# Patient Record
Sex: Male | Born: 1945 | Race: White | Hispanic: No | Marital: Married | Smoking: Never smoker
Health system: Southern US, Community
[De-identification: ages and names within clinical notes are randomized; demographics above are authoritative.]

## PROBLEM LIST (undated history)

## (undated) DIAGNOSIS — I340 Nonrheumatic mitral (valve) insufficiency: Secondary | ICD-10-CM

## (undated) DIAGNOSIS — M1990 Unspecified osteoarthritis, unspecified site: Secondary | ICD-10-CM

## (undated) DIAGNOSIS — I219 Acute myocardial infarction, unspecified: Secondary | ICD-10-CM

## (undated) DIAGNOSIS — I5022 Chronic systolic (congestive) heart failure: Secondary | ICD-10-CM

## (undated) DIAGNOSIS — I4891 Unspecified atrial fibrillation: Secondary | ICD-10-CM

## (undated) DIAGNOSIS — Z7901 Long term (current) use of anticoagulants: Secondary | ICD-10-CM

## (undated) DIAGNOSIS — I5042 Chronic combined systolic (congestive) and diastolic (congestive) heart failure: Secondary | ICD-10-CM

## (undated) DIAGNOSIS — I10 Essential (primary) hypertension: Principal | ICD-10-CM

## (undated) DIAGNOSIS — R55 Syncope and collapse: Secondary | ICD-10-CM

## (undated) DIAGNOSIS — I495 Sick sinus syndrome: Secondary | ICD-10-CM

## (undated) HISTORY — DX: Sick sinus syndrome: I49.5

## (undated) HISTORY — DX: Syncope and collapse: R55

## (undated) HISTORY — PX: US ECHOCARDIOGRAPHY: HXRAD669

## (undated) HISTORY — DX: Unspecified atrial fibrillation: I48.91

## (undated) HISTORY — DX: Long term (current) use of anticoagulants: Z79.01

## (undated) HISTORY — DX: Nonrheumatic mitral (valve) insufficiency: I34.0

## (undated) HISTORY — PX: CARDIAC CATHETERIZATION: SHX172

## (undated) HISTORY — DX: Essential (primary) hypertension: I10

## (undated) HISTORY — PX: OTHER SURGICAL HISTORY: SHX169

## (undated) HISTORY — PX: TONSILLECTOMY: SUR1361

## (undated) HISTORY — DX: Chronic systolic (congestive) heart failure: I50.22

---
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
## 1989-06-18 HISTORY — PX: INGUINAL HERNIA REPAIR: SUR1180

## 2003-09-24 ENCOUNTER — Ambulatory Visit (HOSPITAL_COMMUNITY): Admission: RE | Admit: 2003-09-24 | Discharge: 2003-09-24 | Payer: Self-pay | Admitting: Cardiology

## 2003-11-24 ENCOUNTER — Emergency Department (HOSPITAL_COMMUNITY): Admission: EM | Admit: 2003-11-24 | Discharge: 2003-11-25 | Payer: Self-pay | Admitting: Emergency Medicine

## 2004-10-04 ENCOUNTER — Ambulatory Visit: Payer: Self-pay | Admitting: Pulmonary Disease

## 2004-10-04 ENCOUNTER — Inpatient Hospital Stay (HOSPITAL_COMMUNITY): Admission: EM | Admit: 2004-10-04 | Discharge: 2004-10-06 | Payer: Self-pay | Admitting: Emergency Medicine

## 2004-10-05 ENCOUNTER — Encounter: Payer: Self-pay | Admitting: Cardiology

## 2004-10-30 ENCOUNTER — Ambulatory Visit: Payer: Self-pay | Admitting: Pulmonary Disease

## 2005-05-03 ENCOUNTER — Ambulatory Visit: Payer: Self-pay | Admitting: Infectious Diseases

## 2005-05-03 ENCOUNTER — Inpatient Hospital Stay (HOSPITAL_COMMUNITY): Admission: AD | Admit: 2005-05-03 | Discharge: 2005-05-10 | Payer: Self-pay | Admitting: Internal Medicine

## 2005-05-07 ENCOUNTER — Encounter (INDEPENDENT_AMBULATORY_CARE_PROVIDER_SITE_OTHER): Payer: Self-pay | Admitting: *Deleted

## 2006-09-30 IMAGING — CR DG LUMBAR SPINE COMPLETE 4+V
5 series · 5 of 5 positions shown · non-contrast
Comparison: Study 10/04/04.

CLINICAL DATA: Fever, gait instability.  
 V27M1-Q VIEW:

[t l-spine a.p.]
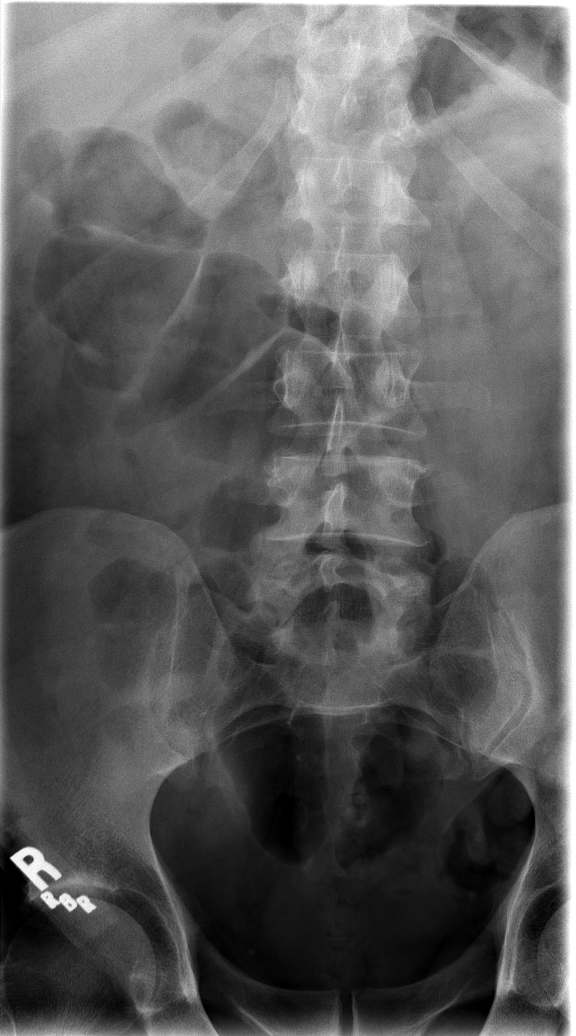

[t l-spine oblique exposure (1 of 2)]
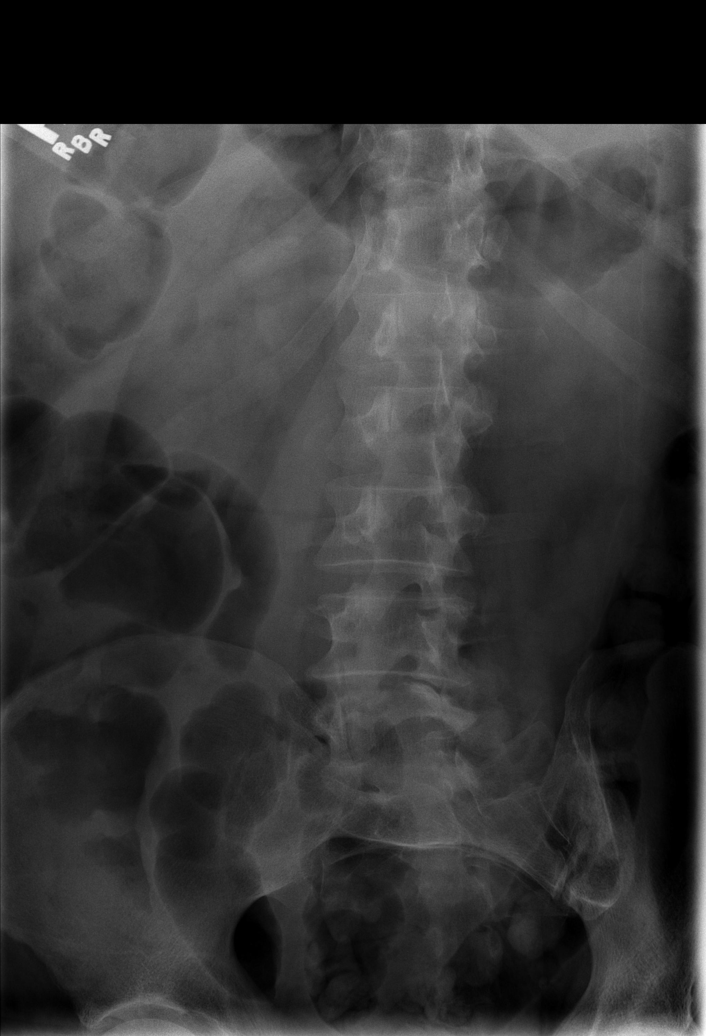

[t l-spine oblique exposure (2 of 2)]
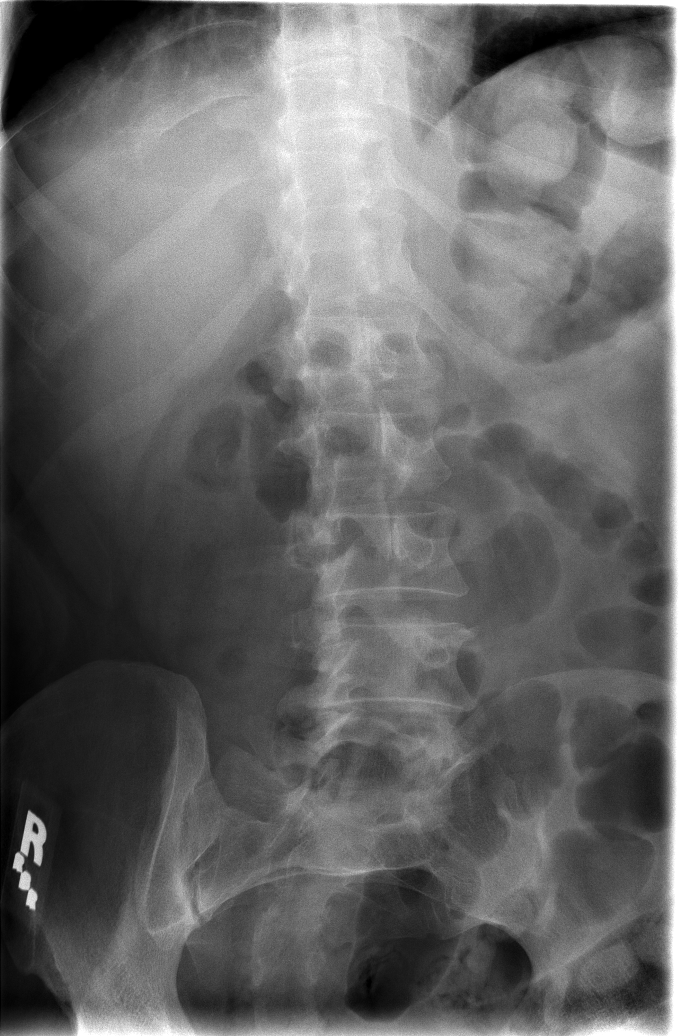

[t l-spine lat]
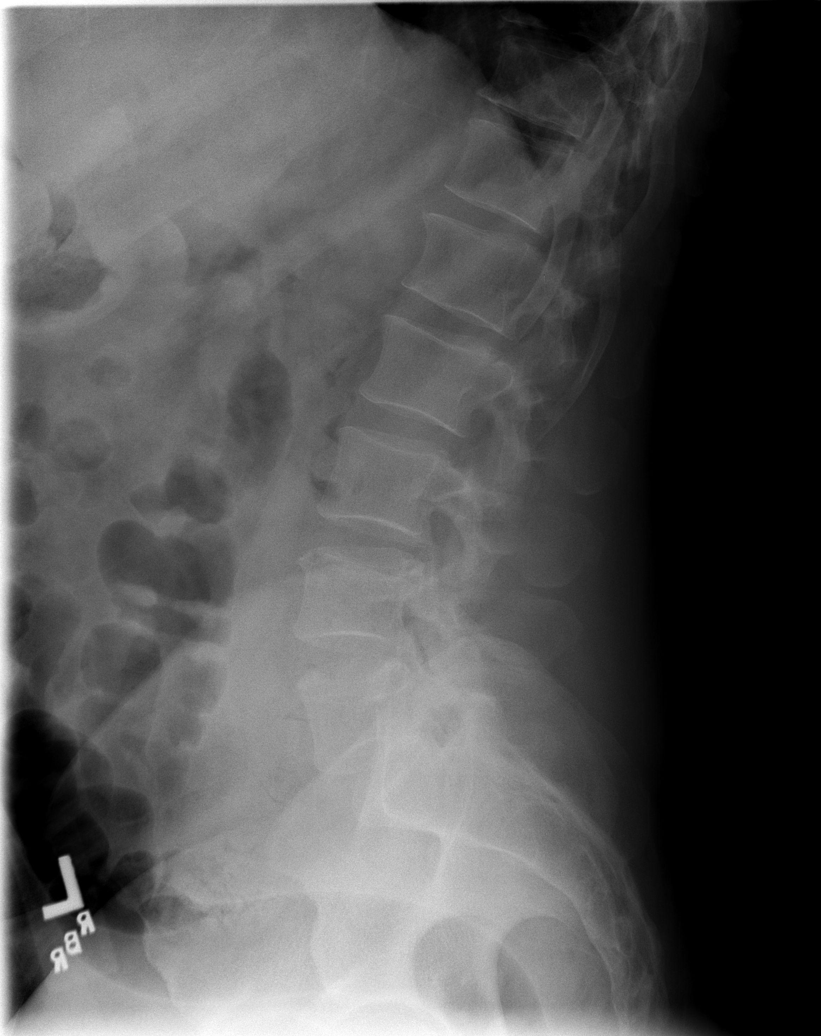

[t l-spine l5-s1 spot]
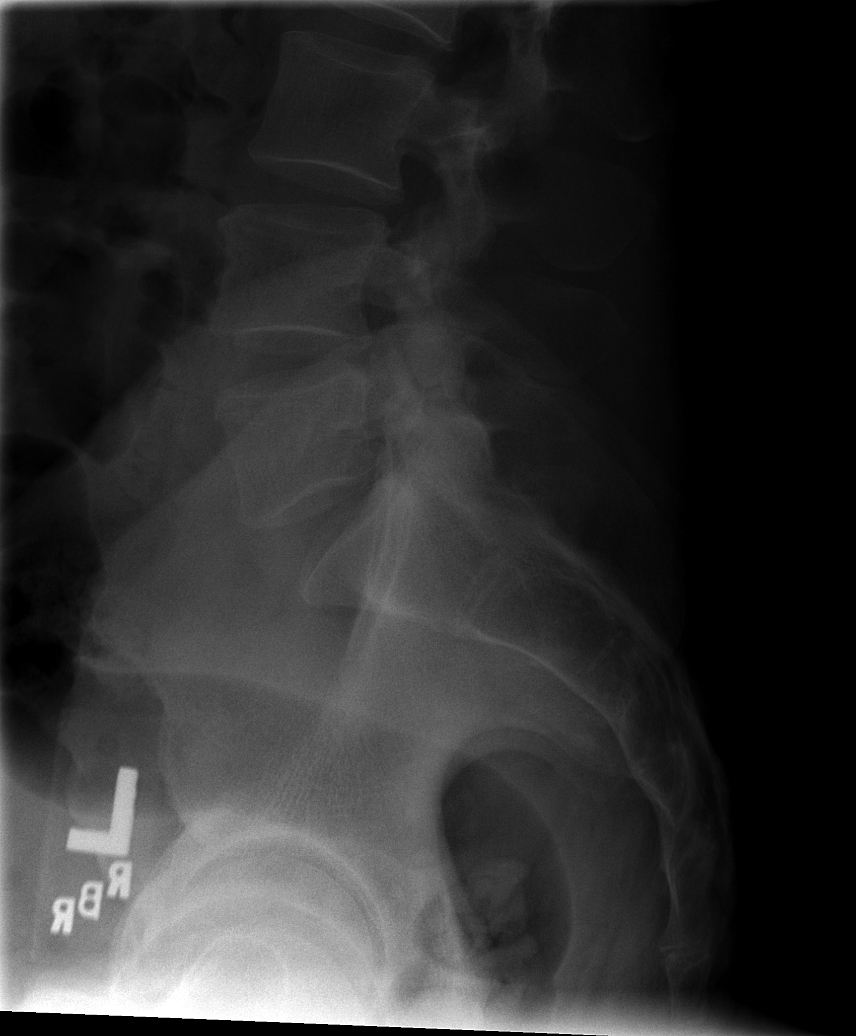

[5 of 5 positions shown; findings below may reference images not displayed]

Cardiomegaly is unchanged.  Lungs remain clear.  No effusion.  No focal bony abnormality.
IMPRESSION: Cardiomegaly without acute disease.
 LUMBAR PZV48-G VIEW:
 Vertebral body height and alignment are maintained.  No pars interarticularis defect.  Disk space height appears maintained.  There is some facet arthropathy in the lower lumbar spine.
IMPRESSION: Lower lumbar facet degenerative disease.  Exam otherwise negative.
 RIGHT RUUU-O VIEW:
 No fracture, dislocation or joint effusion is identified.  Soft tissues are unremarkable.
IMPRESSION: Negative exam.
 SINGLE VIEW PELVIS/RIGHT HIP - 2 VIEW:
 The hips are located bilaterally.  The pelvis is intact.  No notable degenerative change.  Sacroiliac joints appear normal.
IMPRESSION: Negative exam.
 AP AND LATERAL VIEWS RIGHT FEMUR:
 No acute bony or joint abnormality.  Soft tissues unremarkable.
IMPRESSION: Negative exam.

## 2006-09-30 IMAGING — CR DG PELVIS 1-2V
3 series · 3 of 3 positions shown · non-contrast
Comparison: Study 10/04/04.

CLINICAL DATA: Fever, gait instability.  
 V27M1-Q VIEW:

[t pelvis a.p.]
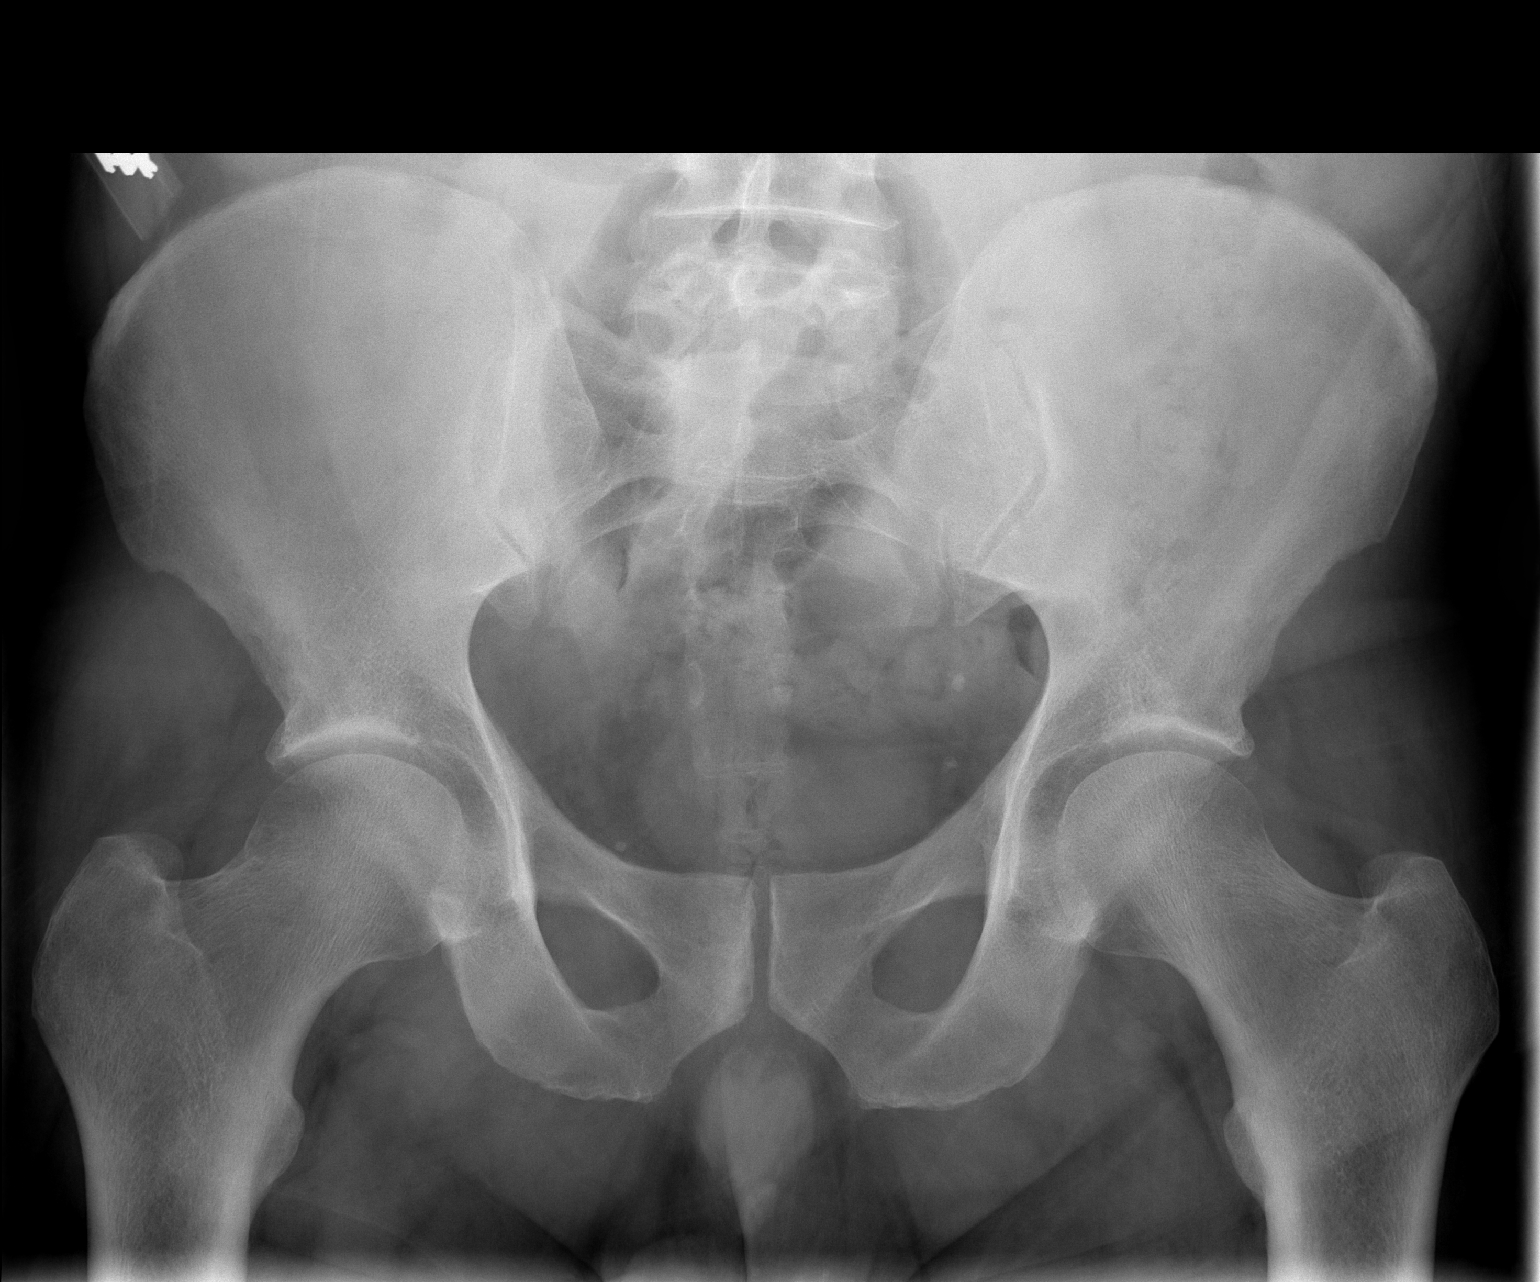

[t hip ap right]
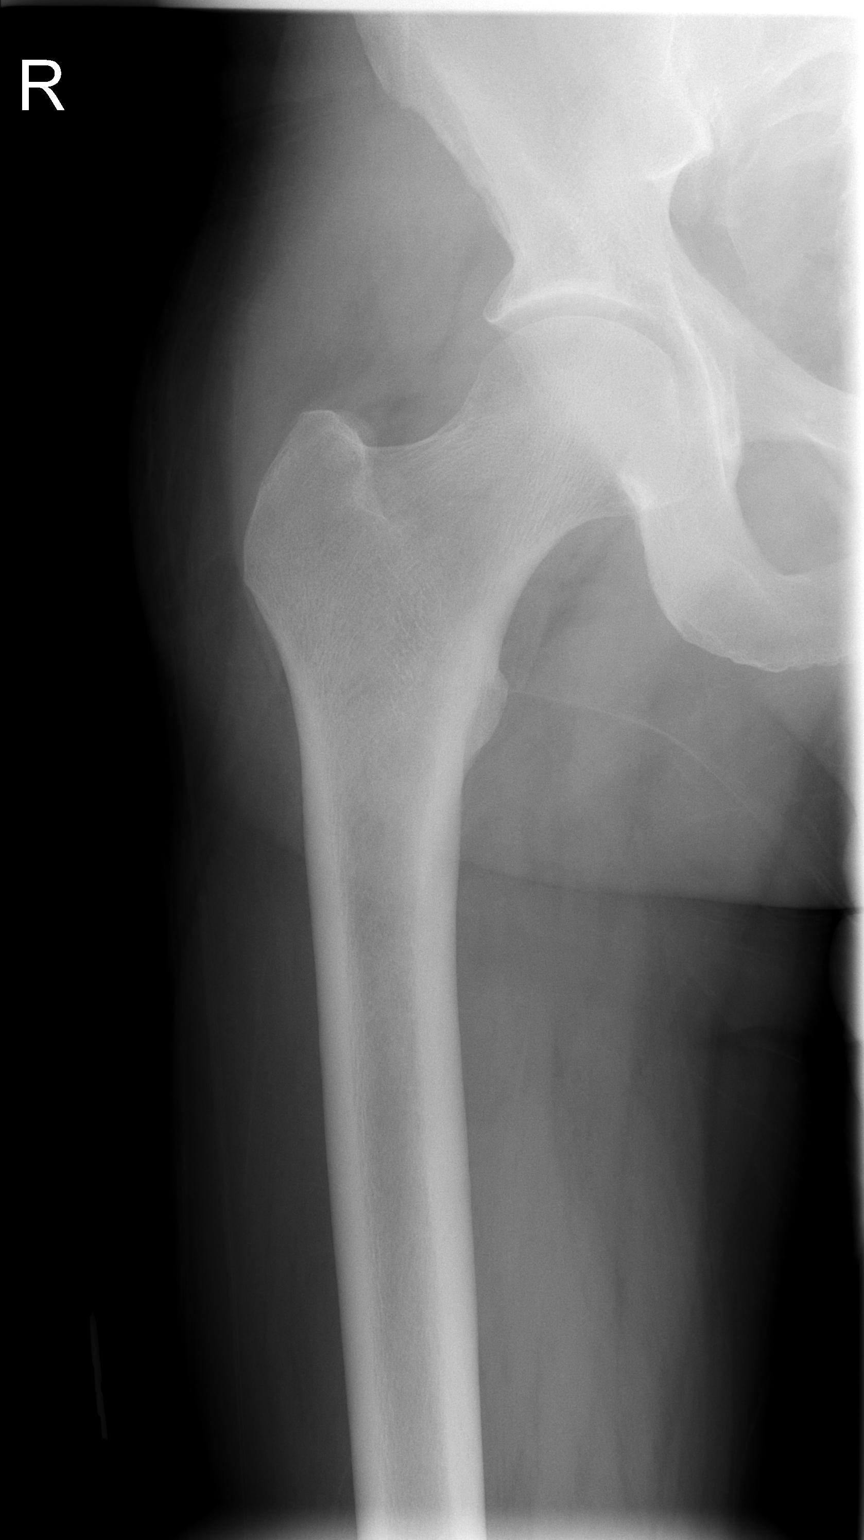

[t hip frog leg right]
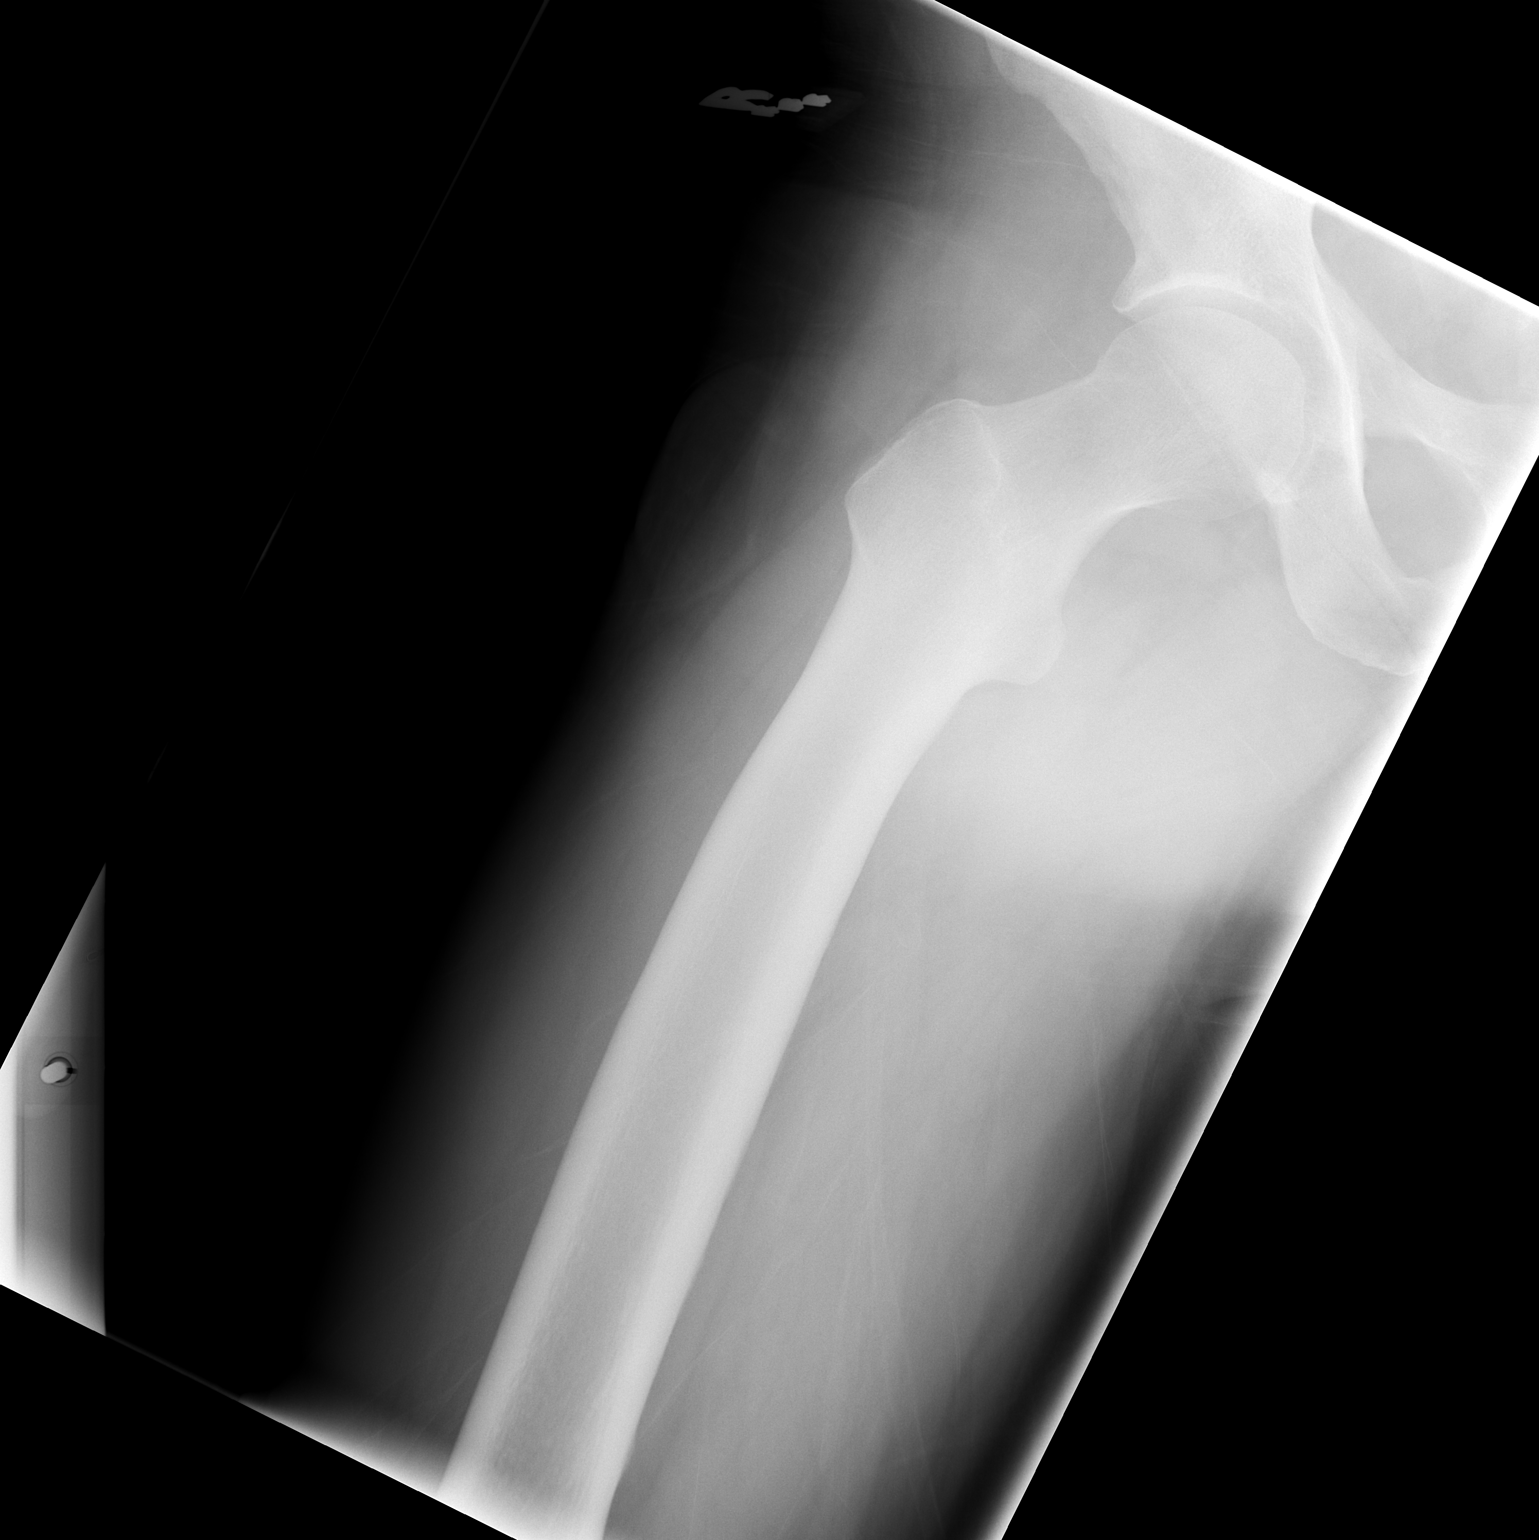

[3 of 3 positions shown; findings below may reference images not displayed]

Cardiomegaly is unchanged.  Lungs remain clear.  No effusion.  No focal bony abnormality.
IMPRESSION: Cardiomegaly without acute disease.
 LUMBAR PZV48-G VIEW:
 Vertebral body height and alignment are maintained.  No pars interarticularis defect.  Disk space height appears maintained.  There is some facet arthropathy in the lower lumbar spine.
IMPRESSION: Lower lumbar facet degenerative disease.  Exam otherwise negative.
 RIGHT RUUU-O VIEW:
 No fracture, dislocation or joint effusion is identified.  Soft tissues are unremarkable.
IMPRESSION: Negative exam.
 SINGLE VIEW PELVIS/RIGHT HIP - 2 VIEW:
 The hips are located bilaterally.  The pelvis is intact.  No notable degenerative change.  Sacroiliac joints appear normal.
IMPRESSION: Negative exam.
 AP AND LATERAL VIEWS RIGHT FEMUR:
 No acute bony or joint abnormality.  Soft tissues unremarkable.
IMPRESSION: Negative exam.

## 2006-10-16 ENCOUNTER — Observation Stay (HOSPITAL_COMMUNITY): Admission: EM | Admit: 2006-10-16 | Discharge: 2006-10-17 | Payer: Self-pay | Admitting: Emergency Medicine

## 2008-08-05 ENCOUNTER — Inpatient Hospital Stay (HOSPITAL_COMMUNITY): Admission: EM | Admit: 2008-08-05 | Discharge: 2008-08-07 | Payer: Self-pay | Admitting: Emergency Medicine

## 2008-08-06 ENCOUNTER — Encounter (HOSPITAL_BASED_OUTPATIENT_CLINIC_OR_DEPARTMENT_OTHER): Payer: Self-pay | Admitting: Internal Medicine

## 2010-01-22 ENCOUNTER — Inpatient Hospital Stay (HOSPITAL_COMMUNITY): Admission: AD | Admit: 2010-01-22 | Discharge: 2010-01-24 | Payer: Self-pay | Admitting: Internal Medicine

## 2010-01-23 ENCOUNTER — Encounter (HOSPITAL_BASED_OUTPATIENT_CLINIC_OR_DEPARTMENT_OTHER): Payer: Self-pay | Admitting: Internal Medicine

## 2010-06-11 ENCOUNTER — Ambulatory Visit: Payer: Self-pay | Admitting: Cardiology

## 2010-12-15 ENCOUNTER — Ambulatory Visit: Payer: Self-pay | Admitting: Cardiovascular Disease

## 2011-01-06 LAB — COMPREHENSIVE METABOLIC PANEL
ALT: 27 U/L (ref 0–53)
AST: 23 U/L (ref 0–37)
Albumin: 3.7 g/dL (ref 3.5–5.2)
Alkaline Phosphatase: 65 U/L (ref 39–117)
BUN: 11 mg/dL (ref 6–23)
CO2: 25 mEq/L (ref 19–32)
Calcium: 8.6 mg/dL (ref 8.4–10.5)
Chloride: 108 mEq/L (ref 96–112)
Creatinine, Ser: 0.85 mg/dL (ref 0.4–1.5)
GFR calc Af Amer: >60 mL/min (ref >60)
GFR calc non Af Amer: >60 mL/min (ref >60)
Glucose, Bld: 104 mg/dL — ABNORMAL HIGH (ref 70–99)
Potassium: 3.8 mEq/L (ref 3.5–5.1)
Sodium: 140 mEq/L (ref 135–145)
Total Bilirubin: 1.5 mg/dL — ABNORMAL HIGH (ref 0.3–1.2)
Total Protein: 6.6 g/dL (ref 6.0–8.3)

## 2011-01-06 LAB — PROTIME-INR
INR: 1.86 — ABNORMAL HIGH (ref 0.00–1.49)
INR: 1.92 — ABNORMAL HIGH (ref 0.00–1.49)
Prothrombin Time: 21.3 seconds — ABNORMAL HIGH (ref 11.6–15.2)
Prothrombin Time: 21.8 seconds — ABNORMAL HIGH (ref 11.6–15.2)

## 2011-01-06 LAB — CBC
HCT: 42.8 % (ref 39.0–52.0)
Hemoglobin: 14.5 g/dL (ref 13.0–17.0)
MCHC: 33.9 g/dL (ref 30.0–36.0)
MCV: 91 fL (ref 78.0–100.0)
Platelets: 140 10*3/uL — ABNORMAL LOW (ref 150–400)
RBC: 4.71 MIL/uL (ref 4.22–5.81)
RDW: 14.1 % (ref 11.5–15.5)
WBC: 8.6 10*3/uL (ref 4.0–10.5)

## 2011-01-06 LAB — GLUCOSE, CAPILLARY
Glucose-Capillary: 118 mg/dL — ABNORMAL HIGH (ref 70–99)
Glucose-Capillary: 119 mg/dL — ABNORMAL HIGH (ref 70–99)

## 2011-01-06 LAB — CARDIAC PANEL(CRET KIN+CKTOT+MB+TROPI)
CK, MB: 1.9 ng/mL (ref 0.3–4.0)
CK, MB: 2.2 ng/mL (ref 0.3–4.0)
Relative Index: INVALID (ref 0.0–2.5)
Relative Index: INVALID (ref 0.0–2.5)
Relative Index: INVALID (ref 0.0–2.5)
Total CK: 86 U/L (ref 7–232)
Total CK: 92 U/L (ref 7–232)
Total CK: 97 U/L (ref 7–232)
Troponin I: 0.05 ng/mL (ref 0.00–0.06)
Troponin I: 0.05 ng/mL (ref 0.00–0.06)

## 2011-01-06 LAB — BASIC METABOLIC PANEL
BUN: 10 mg/dL (ref 6–23)
BUN: 13 mg/dL (ref 6–23)
CO2: 25 mEq/L (ref 19–32)
CO2: 27 mEq/L (ref 19–32)
Calcium: 8.5 mg/dL (ref 8.4–10.5)
Chloride: 104 mEq/L (ref 96–112)
Chloride: 105 mEq/L (ref 96–112)
Creatinine, Ser: 0.87 mg/dL (ref 0.4–1.5)
Creatinine, Ser: 0.88 mg/dL (ref 0.4–1.5)
GFR calc Af Amer: >60 mL/min (ref >60)
GFR calc non Af Amer: >60 mL/min (ref >60)
Glucose, Bld: 124 mg/dL — ABNORMAL HIGH (ref 70–99)
Potassium: 3.5 mEq/L (ref 3.5–5.1)
Sodium: 137 mEq/L (ref 135–145)

## 2011-01-06 LAB — BRAIN NATRIURETIC PEPTIDE
Pro B Natriuretic peptide (BNP): 454 pg/mL — ABNORMAL HIGH (ref 0.0–100.0)
Pro B Natriuretic peptide (BNP): 537 pg/mL — ABNORMAL HIGH (ref 0.0–100.0)

## 2011-01-29 ENCOUNTER — Encounter: Payer: Self-pay | Admitting: Cardiology

## 2011-01-29 DIAGNOSIS — I10 Essential (primary) hypertension: Secondary | ICD-10-CM

## 2011-01-29 DIAGNOSIS — Z7901 Long term (current) use of anticoagulants: Secondary | ICD-10-CM | POA: Insufficient documentation

## 2011-01-29 DIAGNOSIS — I340 Nonrheumatic mitral (valve) insufficiency: Secondary | ICD-10-CM | POA: Insufficient documentation

## 2011-01-29 DIAGNOSIS — I4821 Permanent atrial fibrillation: Secondary | ICD-10-CM | POA: Insufficient documentation

## 2011-02-01 ENCOUNTER — Ambulatory Visit: Payer: Self-pay | Admitting: Cardiology

## 2011-03-02 NOTE — H&P (Signed)
NAMEELIYAH, [name] NO.:  [id number]   MEDICAL RECORD NO.:  [id number]          PATIENT TYPE:  INP   LOCATION:  3743                         FACILITY:  [facility]   PHYSICIAN:  Barry Dienes. Eloise Harman, M.D.DATE OF BIRTH:  Feb 14, 1946   DATE OF ADMISSION:  08/05/2008  DATE OF DISCHARGE:                              HISTORY & PHYSICAL   CHIEF COMPLAINT:  Difficulty breathing and chest pain.   HISTORY OF PRESENT ILLNESS:  The patient is a 65 year old white male who  is well-known to me.  He has chronic moderately severe asthma and  chronic atrial fibrillation.  For the past 4-5 days, he has had  gradually increasing cough, and wheezing.  On August 03, 2008, he  called 911 due to worsening dyspnea.  Paramedics administered an  albuterol nebulizer and his symptoms improved.  He declined  hospitalization at that time.  His symptoms continued on August 04, 2008.  Early in the a.m. on August 05, 2008, he had acute dyspnea with  shortness of breath and left lateral and left arm aching chest pain, so  he called 911 and was transported to the [facility] Emergency Room for  evaluation.  His chest discomfort lasted approximately 45 minutes and  resolved with administration of oxygen and initial treatment in the  emergency room.  He also notes that with paroxysmal coughing.  He gets  very lightheaded and passes out.  He has not had fall or injury from  these episodes.  Of note, although Advair has been prescribed for him  due to cough constraints.  He has been using frequent albuterol and  Atrovent nebulizers approximately every 4 hours and his Xopenex HFA  frequently.   PAST MEDICAL HISTORY:  1. Chronic asthma.  2. Chronic atrial fibrillation.  3. Chronic anticoagulation.  4. Hyperlipidemia with intolerance of cholesterol lowering      medications.  5. Congestive heart failure due to diastolic dysfunction with most      recent echocardiogram showing left ventricular ejection  fraction of      45-50% with mild-to-mitral regurgitation and mild tricuspid      regurgitation, and vague history of coronary artery disease with      myocardial infarction with 2004, cardiac catheterization showing      normal coronary arteries with decreased left ventricular systolic      function in the 60% range.  6. He also has a history of seasonal allergic rhinitis.  7. Several years ago, he had right sacroiliac joint infection with      methicillin-sensitive Staphylococcus aureus that was treated      successfully with prolonged antibiotics.  8. Diabetes mellitus, type 2 under good control.   MEDICATIONS PRIOR TO ADMISSION:  1. Coumadin 5 mg daily for 6 days per week and 7.5 mg for 1 day per      week.  2. Xopenex HFA 2 puffs t.i.d. p.r.n.  3. Albuterol 2.5 mg nebulizer treatments every 4 hours p.r.n. dyspnea.  4. Atrovent 0.5 mg nebulizer treatments every 4 hours p.r.n.  5. Cardizem CD 240 mg p.o. daily.  6. Theophylline extended  release 200 mg every a.m.  7. Reglan 5 mg twice daily before meals.  8. Omeprazole 20 mg daily.  9. Metformin 500 mg twice daily.  10.Doxycycline 100 mg twice daily.  11.Benicar 20 mg daily.   ALLERGIES:  ACE INHIBITORS caused cough in the past.   PAST SURGICAL HISTORY:  1. Remote tonsillectomy and adenoidectomy.  2. Right inguinal hernia repair, July 2006.  3. Right index finger surgery.  4. Right sacroiliac joint methicillin-sensitive Staphylococcus aureus.   FAMILY HISTORY:  Mother died at age 40 of myocardial infarction and  liver disease.  Father died at age 22 of coronary artery disease and  hypertension.  A sister died at young age from myocardial infarction.  Brother has hypertension.   SOCIAL HISTORY:  He is married and has 3 adult children.  He is working  as a Electrical engineer.  He has no history of tobacco or alcohol abuse.  His wife had been working as a Freight forwarder and lost the  job last week.   REVIEW OF  SYSTEMS:  Earlier on the morning of admission, he had  substernal chest pain radiating to the left arm that has resolved.  He  has marked dizziness with possible transient loss of consciousness with  bouts of coughing.  He has not had recent fever or chills.  He has a  mildly productive cough.  He has not had recent nausea, vomiting,  diarrhea, constipation, anxiety, or depression.   INITIAL PHYSICAL EXAMINATION:  VITAL SIGNS:  Blood pressure 121/80,  pulse 66, respirations 20, temperature 98, pulse oxygen saturation 93%  on 3 L per minute with nasal cannula oxygen.  Weight is 98.4 kg.  GENERAL:  He is a mildly overweight white male with an occasional dry  cough and no shortness of breath on oxygen.  NECK:  Supple without jugular venous distention or carotid bruit.  CHEST:  Bilateral scattered wheezing and no use of accessory muscles of  respiration.  HEART:  Irregularly irregular rhythm without significant murmur or  gallop.  ABDOMEN:  Normal bowel sounds and no hepatosplenomegaly or tenderness.  EXTREMITIES:  Without cyanosis, clubbing, or edema and the pedal pulses  were normal.  NEUROLOGIC:  He is alert and well oriented showing no focal neurologic  deficits.   INITIAL LABORATORY STUDIES:  BNP 755, serum sodium 138, potassium 3.7,  chloride 105, carbon dioxide 21, BUN 12, creatinine 0.94, glucose 182,  CK-MB 3.7, troponin-I less than 0.05, total protein 6.7, and albumin  3.9.  White blood cell count 10.5 with 82% neutrophils, hemoglobin 15,  hematocrit 44, and platelets 209.  INR 3.1.  Chest x-ray (AP view only)  show a possible right lower lobe infiltrate.  EKG was not available at  the time of dictation.   IMPRESSION AND PLAN:  1. Dyspnea:  Likely secondary to asthma exacerbation with resultant      rapid ventricular rate causing congestive heart failure due to      diastolic dysfunction.  Given his lack of fever early pneumonia      seems unlikely.  His chest pain could be  due to atrial      fibrillation, non-Q-wave myocardial infarction, or unstable angina;      however, his 2004 normal cardiac catheterization argues against      ischemia.  I plan to continue Solu-Medrol IV with albuterol and      Atrovent nebulizers.  We will also add Mucomyst nebulizers to      reduce  his mucous in the airways.  Tomorrow a.m. we will check a PA      and lateral chest x-ray.  For now, we will continue Lasix with      angiotensin-receptor blocker and follow his BNP levels closely.  He      is on IV Cardizem for rate control, which will be continued for      now.  If this does not significantly control his ventricular rate,      we will consider adding a highly selected beta-blocker such as      Bystolic, however, the higher cost of this medication would be an      issue for him.  2. Chest pain:  Although, there are some atypical features, this could      be due to ischemia versus non-Q-wave myocardial infarction.  It is      more likely that his chest discomfort is from atrial fibrillation.      We will continue Cardizem IV for now and check serial cardiac      isoenzymes.  Aldactone 50 mg x1 tonight has been ordered.  We will      recheck a 12-lead EKG in the a.m. for interval changes.  3. Diabetes mellitus, type 2:  Under excellent control with A1c levels      less than 7.0% on Glucophage.  For now, given his congestive heart      failure, Glucophage will be held.  Sliding scale insulin will be      administered as needed.           ______________________________  Barry Dienes Eloise Harman, M.D.     DGP/MEDQ  D:  08/06/2008  T:  08/06/2008  Job:  [id number]

## 2011-03-02 NOTE — Consult Note (Signed)
NAMEJOSEHUA, [name] NO.:  [id number]   MEDICAL RECORD NO.:  [id number]          PATIENT TYPE:  INP   LOCATION:                               FACILITY:  [facility]   PHYSICIAN:  [name], M.D.  DATE OF BIRTH:  07/06/1946   DATE OF CONSULTATION:  08/06/2008  DATE OF DISCHARGE:                                 CONSULTATION   HISTORY OF PRESENT ILLNESS:  Mr. [name] is a 65 year old white male  with known history of asthma who presented after a 5-day history of  worsening shortness of breath, wheezing, and cough.  This is associated  with left chest spasms without pain.  He did complain of some left  shoulder pain.  His cough was productive of scant amounts of green  sputum.  The patient was admitted yesterday.  He was placed on  antibiotics and breathing treatments.  His monitor showed atrial  fibrillation with rapid ventricular response, and he was subsequently  placed on IV Cardizem.  Today, he states his breathing is much improved  and his left shoulder pain and spasms have resolved.  He has noticed no  recent increase in swelling, orthopnea, or weight gain.  Mr. [name] has  a history of congestive heart failure, diagnosed in 2004.  At that time,  he had normal coronary anatomy.  Subsequent followup echocardiogram in  2005 and another TEE in 2006 showed ejection fraction of 40-50%.  He has  mild to moderate mitral insufficiency.  He has chronic atrial  fibrillation and has been treated with rate control with diltiazem SR  240 mg per day and anticoagulation with Coumadin.   PAST MEDICAL HISTORY:  1. Chronic asthma.  2. Chronic atrial fibrillation.  3. Congestive heart failure.  4. Mild to moderate mitral insufficiency.  5. Seasonal allergies.  6. Chronic anticoagulation.   Prior surgeries include:  1. T&A.  2. Right inguinal hernia repair.  3. Surgery on his right index finger.  4. Surgery for right SI joint infection in 2005.   He is allergic to ACE  INHIBITORS which cause a cough.   His medications prior to admission include:  1. Benicar 20 mg per day.  2. Theophylline 200 mg per day.  3. Metoclopramide 5 mg b.i.d.  4. Warfarin 5 mg as directed.  5. Omeprazole 20 mg per day.  6. Diltiazem 240 mg per day.  7. Metformin 500 mg b.i.d.  8. Xopenex p.r.n.  9. Atrovent and albuterol nebulizers q.i.d.   SOCIAL HISTORY:  The patient is married.  He has 3 children.  Denies  tobacco or alcohol use.   In his family history, both parents died in their 16s of myocardial  infarction.  One sister died with a drug overdose.  One brother has  hypertension.   His review of systems is as noted in HPI, otherwise, negative.   PHYSICAL EXAMINATION:  GENERAL:  He is a well-developed white male, in  no apparent distress.  VITAL SIGNS:  His blood pressure 115/74 and pulse is 88 on IV Cardizem  at 5 mg per hour.  Sats are 92% on  5 liters nasal cannula.  HEENT:  Unremarkable  NECK:  He has no jugular venous distention or bruits.  LUNGS:  No significant wheezes, now with scant rhonchi.  CARDIAC:  Irregular rate and rhythm without murmur, rub, or gallop.  ABDOMEN:  Obese, soft, and nontender.  EXTREMITIES:  Without edema or cyanosis.  NEUROLOGIC:  Intact.   LABORATORY DATA:  ECG shows atrial fibrillation with a rate of 87 beats  per minute.  He has T-wave inversions in the anterolateral leads, which  were increased from older ECGs.  Chest x-ray shows cardiomegaly with no  active disease.  CPK-MB was 2.7 and 4.1.  Troponin was less than 0.05  and then 0.07.  Pro time was 26 with an INR of 2.2.  White count 7300,  hemoglobin 14.1, hematocrit 42.6, and platelets 189,000.  BNP level went  from 755 to 677.  Sodium is 132, potassium 4.0, chloride 101, CO2 22,  BUN 20, creatinine 1.10, and glucose 160.   IMPRESSION:  1. Dyspnea:  This appears to be related to acute asthma exacerbation      and/or bronchitis.  Other than an elevated BNP level, I really  see      no evidence of significant congestive heart failure at this time.      I suspect his BNP elevation is also due to his acute asthma      decompensation and his increased ventricular response with his      atrial fibrillation.  2. Atypical chest pain:  His cardiac enzymes do not indicate acute      injury.  3. Atrial fibrillation with increased ventricular response due to his      asthma exacerbation.  4. History of left ventricular dysfunction.   PLAN:  We will transition his Cardizem back to p.o. dose but will  increase it to 300 mg per day, will try discontinue his IV Cardizem,  would avoid beta blockers due to his severe asthma, will gently diurese  with Lasix, will follow up on his echocardiogram results today, and will  continue his angiotensin receptor blocker.  We will continue with  antibiotics and respiratory treatments.           ______________________________  [name], M.D.     PMJ/MEDQ  D:  08/06/2008  T:  08/07/2008  Job:  [id number]   cc:   Barry Dienes. Eloise Harman, M.D.

## 2011-03-02 NOTE — Discharge Summary (Signed)
[name], [name] NO.:  [id number]   MEDICAL RECORD NO.:  [id number]          PATIENT TYPE:  INP   LOCATION:  3743                         FACILITY:  [facility]   PHYSICIAN:  Barry Dienes. Eloise Harman, M.D.DATE OF BIRTH:  08-23-1946   DATE OF ADMISSION:  08/05/2008  DATE OF DISCHARGE:  08/07/2008                               DISCHARGE SUMMARY   CHIEF COMPLAINT:  Difficulty breathing and chest pain.   HISTORY OF PRESENT ILLNESS:  The patient is a 65 year old white male who  is well known to me.  He has chronic moderately severe asthma and  chronic atrial fibrillation for which he is on Coumadin.  For the past 4-  5 days prior to admission, he had gradually increasing cough and  wheezing.  On August 03, 2008, he called 911 due to worsening dyspnea.  Paramedics administered an albuterol nebulizer and his symptoms  improved.  He declined hospitalization at that time.  His symptoms  continued on August 04, 2008.  Early in the morning on August 05, 2008, he had acute dyspnea with shortness of breath and left lateral and  left arm aching pain, so he called 911 and was transported to the [facility] Emergency Room for evaluation.  His chest discomfort lasted  approximately 45 minutes and were resolved with the administration of  oxygen and initial treatment in the emergency room.  He also notes that  with paroxysmal coughing, he gets very lightheaded and passes out.  He  has not had fall or injury from these episodes.  Of note, although  Advair has been prescribed for him, he has not been using this due to  financial constraints.  He has been using albuterol and Atrovent  nebulizers approximately every 4 hours and his Xopenex HFA frequently.   PAST MEDICAL HISTORY:  1. Chronic asthma.  2. Chronic atrial fibrillation.  3. Chronic anticoagulation.  4. Hyperlipidemia with intolerance of cholesterol-lowering      medications.  5. Congestive heart failure due to diastolic  dysfunction with most      recent echocardiogram showing left ventricular ejection fraction of      45-50% with mild-to-moderate mitral regurgitation and mild      tricuspid regurgitation.  6. Vague history of coronary artery disease with myocardial      infarction, with 2004 cardiac catheterization showing normal      coronary arteries with decreased left ventricular systolic      function, it appeared in the 25% range.  7. He also has a history of seasonal allergic rhinitis.  8. Several years ago, he had right sacroiliac joint infection with      methicillin-sensitive Staphylococcus aureus that was treated      successfully with prolonged antibiotics.  9. Diabetes mellitus, type 2 under good control.   MEDICATIONS PRIOR TO ADMISSION:  1. Coumadin 5 mg daily for 6 days per week and 7.5 mg daily for 1 day      per week.  2. Xopenex HFA 2 puffs p.o. t.i.d. p.r.n. wheezing.  3. Albuterol 2.5 mg nebulizer treatments up to every 4 hours p.r.n.  dyspnea.  4. Atrovent 0.5 mg nebulizer treatments every 4 hours p.r.n. dyspnea.  5. Cardizem CD 240 mg p.o. daily.  6. Theophylline extended release 200 mg p.o. every a.m.  7. Reglan 5 mg p.o. b.i.d. before meals.  8. Omeprazole 20 mg p.o. daily.  9. Metformin ER 500 mg twice daily.  10.Doxycycline 100 mg twice daily.  11.Benicar 20 mg daily.   ALLERGIES:  ACE INHIBITORS have caused cough in the past.   See admission history and physical for details of his past surgical  history, family history, and social history.   REVIEW OF SYSTEMS:  On the morning of admission, he had substernal chest  discomfort radiating to the left arm that lasted 45 minutes and  resolved.  He has had marked dizziness with bouts of coughing.  He has  not had recent fever or chills and has a mildly productive cough.  He  has not had nausea, vomiting, diarrhea, constipation, anxiety, or  depression.   INITIAL PHYSICAL EXAMINATION:  VITAL SIGNS:  Blood pressure  121/80,  pulse 66, respirations 20, temperature 98, pulse oxygen saturation 93%  on 3 L per minute of nasal cannula oxygen, weight was 98.4 kg.  GENERAL:  He is a mildly overweight white male who had an occasional dry  cough with no shortness of breath on nasal cannula oxygen.  HEAD, EYES, EARS, NOSE, AND THROAT:  Within normal limits.  NECK:  Supple and without jugular venous distention or carotid bruit.  CHEST:  Bilateral scattered wheezing with no use of accessory muscles of  respiration.  HEART:  Irregularly irregular rhythm without significant murmur or  gallop.  ABDOMEN:  Normal bowel sounds and no hepatosplenomegaly or tenderness.  EXTREMITIES:  Without cyanosis, clubbing, or edema.  NEUROLOGICAL:  He was alert and well oriented showing no focal  neurologic deficits.   INITIAL LABORATORY STUDIES:  BNP 755, serum sodium 138, potassium 3.7,  chloride 105, carbon dioxide 21, BUN 12, creatinine 0.94, glucose 182,  CK-MB 3.7, troponin I less than 0.05, total protein 6.7, albumin 3.9.  White blood cell count 10.5 with 82% neutrophils, hemoglobin 15,  hematocrit 44, platelets 209, INR 3.1.  Chest x-ray (AP view only)  showed a possible right lower lobe infiltrate.  EKG showed atrial  fibrillation with a rapid ventricular rate and T-wave inversions in the  inferolateral leads.   HOSPITAL COURSE:  The patient was admitted to a medical bed with  telemetry.  He had serial cardiac isoenzymes drawn that were within  normal limits.  He was treated with IV Solu-Medrol and continued  albuterol and Atrovent nebulizers.  In addition, Mucomyst nebulizers  were added to his regimen.  A PA and lateral chest x-ray showed  increased density in the right lower lobe.  He was also treated with  Lasix added to his Benicar.  Initially, he was on IV Cardizem for heart  rate control as his heart rate was up in the 150 range.  He had a  transthoracic echocardiogram done that showed left ventricular  ejection  fraction of 40-50% with moderate concentric LVH, mild aortic valve  sclerosis without stenosis, biatrial enlargement, and mild-to-moderate  mitral regurgitation.   CONDITION ON DISCHARGE:  VITAL SIGNS:  Blood pressure 120/89, pulse 103,  respirations 22, temperature 97.8, pulse oxygen saturation is 95% on 5 L  per minute of nasal cannula oxygen.  GENERAL:  He is a mildly overweight white male who is in no apparent  distress on nasal cannula oxygen.  CHEST:  Clear to auscultation.  HEART:  Slightly irregular rhythm at a slower rate without significant  murmur.  ABDOMEN:  Normal bowel sounds and no tenderness.  EXTREMITIES:  Very minimal bilateral trace ankle edema.   Most recent laboratory studies included serum sodium 140, potassium 4.4,  chloride 109, carbon dioxide 24, BUN 22, creatinine 1.11, glucose 142.  White blood cell count 11.9, hemoglobin 14, hematocrit 42, platelets  196.  INR 2.1.  BNP 396 and troponin-I level 0.07 with CK-MB 4.1.   COMPLICATIONS:  None.   DISCHARGE DIAGNOSES:  1. Asthma exacerbation.  2. Acute bronchitis.  3. Acute congestive heart failure due to diastolic dysfunction.  4. Hypertension, essential, controlled.  5. Gastroesophageal reflux.  6. Diabetes mellitus, type 2, controlled.  7. Anticoagulation.  8. Chest pain, precordial without evidence of myocardial infarction.   DISCHARGE MEDICATIONS:  1. Prednisone 20 mg tablets, take 2 tablets daily for 4 days, then 1      tablet daily for 7 days.  2. Albuterol 2.5 mg nebulizer treatments to be taken up to every 4      hours as needed for difficulty breathing.  3. Atrovent 0.5 mg nebulizer treatments taken 2-4 times daily.  4. Cardizem CD 300 mg p.o. daily.  5. Theo-Dur 200 mg every a.m.  6. Omeprazole 20 mg daily.  7. Metformin ER 500 mg p.o. b.i.d.  8. Benicar 20 mg daily.  9. Lasix 40 mg daily.  10.Coumadin 5 mg tablets take 1 and 1-1/2 tablets once per week and 1      tablet 6 days  per week.  11.Reglan 5 mg p.o. before each meal.  12.Xopenex HFA 2 puffs p.o. t.i.d. p.r.n. wheezing.  13.Humibid DM one cap p.o. b.i.d.   DISPOSITION AND FOLLOWUP:  He will be checked by the nursing staff on  room air prior to discharge to see what his pulse oxygen level is at  rest and with walking.  If his pulse oxygen level drops less than 88%,  he will be prescribed nasal cannula oxygen at 2 L per minute at bedtime.  He is to call Dr. Eloise Harman if his symptoms worsen as soon as possible.  He is to call [phone] to schedule a followup visit with Dr. Eloise Harman  next week.           ______________________________  Barry Dienes. Eloise Harman, M.D.     DGP/MEDQ  D:  08/07/2008  T:  08/08/2008  Job:  [id number]

## 2011-03-05 NOTE — Discharge Summary (Signed)
[name], [name] NO.:  [id number]   MEDICAL RECORD NO.:  [id number]          PATIENT TYPE:  INP   LOCATION:  4706                         FACILITY:  [facility]   PHYSICIAN:  [name], M.D.DATE OF BIRTH:  1946/05/24   DATE OF ADMISSION:  10/16/2006  DATE OF DISCHARGE:                               DISCHARGE SUMMARY   PERTINENT FINDINGS:  The patient is a 65 year old white male with  several medical problems who complained of several days of worsening dry  cough with dry heaves and mild-to-moderate dyspnea.  On the day of  admission he had vague left parasternal chest pain that was mild and  lasted approximately 10 minutes.  It was not associated with diaphoresis  or nausea.  He had a Cardiolite test in October 2006 which showed no  evidence of ischemia.   PAST MEDICAL HISTORY:  Most significant for chronic asthma, chronic  atrial fibrillation, congestive heart failure due to diastolic  dysfunction, hyperlipidemia, coronary artery disease, seasonal allergic  rhinitis and chronic anticoagulation.   MEDICATIONS PRIOR TO ADMISSION:  Nasonex 1-2 sprays each nostril once  daily, Lasix 40 mg daily, Coumadin 7.5 mg daily, Xopenex metered-dose  inhaler 45 mcg t.i.d. Advair 250/50 1 inhalation twice daily,  theophylline 200 mg every a.m., Spiriva 18 mcg daily, Toprol XL 12.5 mg  daily, Cardizem CD 240 mg daily, Protonix 40 mg daily.  Lipitor 10 mg  daily.   INITIAL PHYSICAL EXAM:  VITAL SIGNS:  Blood pressure 143/93, pulse 78,  respirations 26, temperature 97.8, pulse oxygen saturation 94% on room  air.  GENERAL:  He is a mildly overweight white male who is in no apparent  distress while sitting in a gurney.  HEAD, EYES, EARS, NOSE AND THROAT EXAM:  Significant for mild rhinitis.  NECK:  Supple without jugular venous distension or carotid bruit.  CHEST:  Bilateral scattered wheezing without use of accessory muscles of  respiration.  HEART:  Irregularly  irregular rhythm without significant murmur or  gallop.  ABDOMEN:  Benign.  EXTREMITIES:  Without edema.  NEUROLOGICAL EXAM:  Nonfocal.   INITIAL LABORATORY STUDIES:  Serum sodium 137, potassium 3.2, BUN 12,  creatinine 1.0, INR 3.1, CK-MB less than 1.0, troponin I less than 0.05,  D-dimer was 1.28.   EKG showed the following:  1.  Atrial fibrillation. 2.  Nonspecific ST-  segment and T-wave abnormalities.   Chest x-ray showed the following:  1.  Mild kyphosis.  2.  No acute  cardiopulmonary disease.   The hemoglobin was 15, hematocrit 44.  Arterial blood gas had pH 7.44,  pCO2 37, total carbon dioxide 26.  Total protein 6.8, albumin 3.8, BNP  222, serum theophylline 6.6.   HOSPITAL COURSE:  The patient was admitted to a medical bed with  telemetry.  He continued to show atrial fibrillation overnight.  He  showed no other significant arrhythmias and no signs of ischemia.  He  was started on IV corticosteroids and continued albuterol and Atrovent  nebulizer treatments.  Mucomyst was added to his regimen.   PROCEDURES:  None.   CONDITION ON DISCHARGE:  His breathing and cough is improved from the  time of the emergency room evaluation.  He has not had recurrent chest  pain.  Most recent vital signs include blood pressure 115/82, pulse 82,  respirations 20, temperature 97.8, pulse oxygen saturation 95% on 2  L/min.  The cardiac isoenzyme testing included troponin I 0.04 and 0.03  and CK 128 and 93.  Most recent exam shows a mildly overweight white  male with occasional dry cough but no shortness of breath on room air.  Chest had decrease in his bilateral scattered wheezing.  Heart had an  irregularly irregular rhythm without significant murmur or gallop and  extremities were without edema.  EKG on the morning of discharge had  atrial fibrillation with a ventricular rate of 87 and nonspecific ST-  segment and T-wave abnormalities.   DISCHARGE DIAGNOSES:  1. Chest pain,  precordial with no evidence of myocardial infarction      and serial cardiac isoenzymes testing.  2. Asthma exacerbation.  3. Congestive heart failure with diastolic dysfunction.  4. Seasonal allergic rhinitis.  5. Chronic anticoagulation.  6. Chronic atrial fibrillation.  7. Gastroesophageal reflux disease.  8. Hyperlipidemia.   DISCHARGE MEDICATIONS:  Prednisone 10 mg tablets take 4 tablets daily  for 3 days, then 3 tablets daily for 3 days, then 2 tablets daily for 3  days, then 1 tablet daily for 3 days,Histussin HC 1-2 teaspoons p.o. q.4  h p.r.n. cough 8 ounces refills 2, Benicar 20 mg 1 tablet p.o. daily (he  was advised that he could pick up a trial of samples at our office),  Nasonex 1-2 sprays each nostril daily, Lasix 40 mg p.o. daily, Coumadin  7.5 mg p.o. daily, Xopenex meter-dose inhaler 45 mcg p.o. t.i.d.; Advair  50/250 1 inhalation twice daily, Theo-Dur 200 mg p.o. daily, Spiriva  18  mcg daily, Toprol XL 12.5 mg p.o. daily, Cardizem CD 20 40 mg p.o.  daily, Protonix 40 mg p.o. daily, Lipitor 10 mg p.o. daily.   DISPOSITION AND FOLLOW UP:  The patient was advised to have a followup  evaluation with Dr. Jarome [name] at [facility] in  approximately 1 week following discharge.  He was advised to call [phone] to schedule that appointment.           ______________________________  [name], M.D.     DGP/MEDQ  D:  10/17/2006  T:  10/17/2006  Job:  [id number]   cc:   [name], M.D.  Rickey Balm Sung Amabile, MD

## 2011-03-05 NOTE — Discharge Summary (Signed)
NAMEJEDAIAH, [name] NO.:  [id number]   MEDICAL RECORD NO.:  [id number]          PATIENT TYPE:  INP   LOCATION:  5032                         FACILITY:  [facility]   PHYSICIAN:  Barry Dienes. Eloise Harman, M.D.DATE OF BIRTH:  October 10, 1946   DATE OF ADMISSION:  05/03/2005  DATE OF DISCHARGE:  05/10/2005                                 DISCHARGE SUMMARY   PERTINENT FINDINGS:  The patient is a 65 year old white male with several  medical problems.  He complained of three days of right hip, thigh and knee  pain with any weight-bearing.  He began to have mild right hip pain after  cutting grass three days prior to admission. He took a nap after cutting  grass and then awoke with intensity 10/10 pain in the right thigh with any  attempts to bear weight.  Since that time he had been at rest.  He denied  recent cough, shortness of breath, dysuria, frequency, abdominal pain or  diarrhea.  He is on chronic Coumadin treatment for atrial fibrillation with  a dilated cardiomyopathy, however, he had not had his INR level tested in  several months.   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  At most recent follow up his left ventricular ejection  fraction was 30 to 35% in 2005.  He also has moderately severe asthma.   MEDICATIONS PRIOR TO ADMISSION:  1.  Albuterol metered dose inhaler two to four puffs q.i.d. PRN dyspnea.  2.  Advair 250/50 one inhalation twice daily.  3.  Flonase two sprays each nostril once daily.  4.  Coumadin 5 mg p.o. daily.  5.  Lasix 40 mg daily.  6.  Diovan 80 mg daily.  7.  Spironolactone 25 mg daily.  8.  Lipitor 10 mg daily.   ALLERGIES:  ACE INHIBITORS have been associated with a cough.   PHYSICAL EXAMINATION:  VITAL SIGNS:  On initial physical examination the  blood pressure was 138/66, pulse 68, respirations 24, temperature 100.33F.  peak expiratory flow 390L per minute.  Pulse oxygen saturation 98% on room  air.  GENERAL APPEARANCE:  He is a mildly overweight white male who had pain in  the right thigh when sitting that was of moderate intensity.  HEAD, EYES, EARS, NOSE, THROAT:  Examination within normal limits.  NECK:  Supple and without jugular venous distention or carotid bruit.  CHEST:  Clear to auscultation.  HEART: Had an irregularly irregular rhythm without murmur or gallop.  EXTREMITIES:  Without cyanosis or edema.  There was moderate tenderness to  palpation of the mid anterior right thigh with slight erythema but no  fluctuant's.  The right knee had a small effusion but no warmth around the  knee and no pain with active and passive range of motion in the right knee.  In addition there was no pain with passive internal and external rotation of  the right hip joint.  Both feet had normal warmth and normal  pedal pulses.  ABDOMEN: Had normal bowel sounds with no hepatosplenomegaly or tenderness.  NEUROLOGICAL:  He was alert and oriented X3.  Cranial nerves II-XII were  significant for a very mild bilateral hearing loss.  He was not able to  fully bear weight on his right lower extremity with moderate two person  assistance in standing.   LABORATORY DATA:  White blood cell count 14.6, hemoglobin 13, hematocrit  37.2, platelet count 122,000, 12.1 granulocytes.  Pro Time 16.8, INR 1.3.  Erythrocyte sedimentation rate 23.  Serum sodium 133, potassium 3.7,  chloride 102, cO2 23, BUN 17, creatinine 1.2, glucose 174.  Alkaline  phosphatase 38, SGOT 20, serum albumin 3.3.   HOSPITAL COURSE:  The patient was admitted to a medical bed with telemetry.  He was started on empiric Rocephin.  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  He then  went on to have a MRI scan of the pelvis which showed right sacroiliac joint  infection with phlegmon formation but no clear abscess.  His blood cultures  grew out methicillin sensitive Staphylococcus aureus.  He was seen by  infectious disease consultant who recommended four weeks of intravenous  Ancef treatment.  A cardiologist evaluated him for the possibility of  endocarditis with a transesophageal echocardiogram that showed left  ventricular systolic ejection fraction of 40 to 45% with moderate mitral  regurgitation.  There were no valvular vegetations. There was mild to  moderate biatrial enlargement.  There was left atrial smoke seen but no  thrombus in the left atrial appendage.   PROCEDURES:  May 03, 2005 x-rays of the lumbar spine, pelvis, right hip,  right femur, and right knee.  May 05, 2005 MRI scan of the pelvis with  contrast, May 07, 2005 transesophageal echocardiogram.   COMPLICATIONS:  None.   CONDITION ON DISCHARGE:  He denies any shortness of breath, cough, abdominal  pain, diarrhea or constipation.  He has mild pain with weight-bearing on the  right lower extremity.  His chest is clear to auscultation. Heart shows an  irregularly, irregular rhythm with a systolic ejection murmur grade 1/6 at  the left sternal border.  His abdomen is benign.  With gait assessment he is  able to change from supine to a sitting position to standing with a walker  and ambulate in the room independently, albeit with mild right pelvis pain.   Most recent vital signs include a blood pressure of 118/75, pulse 90,  respirations 20, temperature 97.1, pulse oxygen saturation 94% on room air.  Most recent laboratory tests include a white blood cell count of 7.3,  hemoglobin 12, hematocrit 36, platelet count 294,000. INR 2.6. Erythrocyte  sedimentation rate 80.  Serum sodium 137, potassium 4.0, chloride 110, cO2  24, BUN 9, creatinine 0.9, glucose  135.  DISCHARGE DIAGNOSES:  1.  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  2.  Gait instability.  3.  Asthma.  4.  Seasonal allergic rhinitis.  5.  Anticoagulation.  6.  Hypertension.  7.  Atrial fibrillation.  8.  Constipation.  9.  Hyperlipidemia.   DISCHARGE MEDICATIONS:  1.  Albuterol metered dose inhaler two puffs p.o. q.i.d. PRN wheezing or      shortness of breath.  2.  Advair 250/50, one inhalation twice daily.  3.  Flonase two sprays each  nostril once daily.  4.  Coumadin 5 mg every Monday, Wednesday and Friday and 7.5 mg other days      of the week.  5.  Diovan 80 mg p.o. daily.  6.  Toprol XL 25 mg p.o. daily.  7.  Ancef 2 grams intravenously q.8h. in D5W 50 mL with an end date of      May 31, 2005.  8.  Vicodin 5/500 one tab p.o. t.i.d. PRN pain, #90, refill X1.  9.  MiraLax 17 grams in 8 ounces fluid once daily PRN constipation.  10. Lipitor 10 mg p.o. daily.   FOLLOW UP PLANS:  He was advised to see Dr. Eloise Harman at [facility] on Friday, June 14, 2005 and was advised to call [phone] to  schedule that appointment.  He was also advised to see Dr. Aldean Baker in  approximately one month following discharge and was advised to call [phone]  to schedule that appointment.   DISCHARGE SPECIAL INSTRUCTIONS:  He will have a visit with a Coumadin level  check on Friday, June 14, 2005 and at least once weekly while on  antibiotic treatment.  He was also advised to have Coumadin follow up checks  at least once monthly even when antibiotics are discontinued.  He should  monitor his weights once daily and call if his weight increases by 3 pounds  from today's weight.       DGP/MEDQ  D:  05/10/2005  T:  05/10/2005  Job:  [id number]   cc:   Nadara Mustard, MD  [address]  [place]  [place] [address]  Fax: [phone]   Fransisco Hertz, M.D.  [address]  [place] [address]  Fax: [phone]   Peter  M. Swaziland, M.D.  [address], [address]  [place], [place] [address]  Fax: [phone]   Oley Balm. Sung Amabile, M.D. [facility]

## 2011-03-05 NOTE — H&P (Signed)
NAMECHRSITOPHER, WIK NO.:  [id number]   MEDICAL RECORD NO.:  [id number]          PATIENT TYPE:  EMS   LOCATION:  MAJO                         FACILITY:  [facility]   PHYSICIAN:  Darden Palmer., M.D.DATE OF BIRTH:  1946-06-03   DATE OF ADMISSION:  10/04/2004  DATE OF DISCHARGE:                                HISTORY & PHYSICAL   REASON FOR ADMISSION:  Dyspnea.   HISTORY:  The patient is a 65 year old male who was seen for evaluation of  atrial fibrillation with a rapid ventricular response, asthma and  cardiomyopathy.  The patient was diagnosed with new-onset of atrial  fibrillation about 1 year ago.  At that time he was evaluated by Dr. Swaziland  at which time he was on Calan.  A Cardiolite stress test showed an ejection  fraction of 19%, and he had a large fixed inferior and apical defect noted.  Cardiac catheterization showed normal right heart pressures, dilated LV with  global hypokinesis and normal coronary arteries.  He was treated at that  time with Coumadin therapy and rate control was changed to Coreg 25 mg  b.i.d.  He was last seen by Dr. Swaziland 6 months ago.  He has had worsening  dyspnea with exacerbations of what was felt to be asthma by Dr. Eloise Harman  over the past few month.  He has been treated with intermittent courses of  antibiotics as well as steroids and inhalers.  He has continued to have  worsening dyspnea and while on a trip to [place] several weeks ago had  severe dyspnea to the point that he almost went to the emergency room.  There has been some debate about whether the dyspnea was due to congestive  heart failure or to asthma.  He was taken off of Coreg and placed on Toprol  about a month ago, and also was taken off ACE inhibitors and placed on  Diovan about that same time.  He had worsening dyspnea over the past few  days with cough, some PND, but no edema and went to the [facility] where he was found to be in rapid  atrial fibrillation and was  transferred here.  He continues to be significantly dyspneic on exertion and  has been using an albuterol inhaler.  He has been treated with previous  courses of corticosteroids and antibiotics.   PAST MEDICAL HISTORY:  1.  Asthma.  2.  Hypertension.  3.  Hyperlipidemia.  4.  Cardiomyopathy previously.   PAST SURGICAL HISTORY:  1.  Hernia repair.  2.  Tendon repair to his right index finger.  3.  Tonsillectomy.   ALLERGIES:  None.   CURRENT MEDICATIONS:  1.  Advair 250/50 b.i.d.  2.  Flonase.  3.  Coumadin 5 mg daily.  4.  Diovan 80 mg daily.  5.  Lipitor 10 mg daily.  6.  Toprol XL 100 mg daily.  7.  Coumadin 5 mg daily.   SOCIAL HISTORY:  He is a nonsmoker and does not use alcohol to excess.  He  has been married for 33 years  and has 3 children.  He works for [employer], mainly doing Location manager.   FAMILY HISTORY:  Father died age 28 of myocardial infarction, hypertension.  Mother died age 23 of myocardial infarction and liver failure.  One brother  with hypertension.  One sister died with an overdose.   REVIEW OF SYSTEMS:  He has been modestly obese.  He has no skin problems.  He has no eye, ear, nose or throat problems.  He does have asthma that has  been longstanding.  He denies any TIA or stroke symptoms.  He has no history  of claudication previously.  He has no history of ulcers, hiatal hernia or  indigestion.  He has not had any urinary symptoms.   PHYSICAL EXAMINATION:  GENERAL:  He is an overweight male in no acute  distress.  VITAL SIGNS:  Blood pressure currently 140/80, pulse currently 110 and  irregular.  SKIN:  Warm and dry.  ENT:  EOMI.  PERLA.  CNS clear.  Fundi not examined.  Pharynx is negative.  NECK:  Supple without masses, no JVD, thyromegaly or bruits.  LUNGS:  Reveal diffuse inspiratory and expiratory wheezing.  CARDIAC:  Exam shows a rapid irregular rhythm, no murmur.  ABDOMEN:  Soft and nontender,  no masses, hepatosplenomegaly or aneurysm.  EXTREMITIES:  Femoral and distal pulses are 2+.  There was no peripheral  noted.   LABORATORY:  A 12-lead EKG shows atrial fibrillation initially with  controlled response.  After albuterol response was increased.  Chest x-ray  is clear.  BNP level is only 67.   IMPRESSION:  1.  Dyspnea with wheezing which I think is primarily asthma by history,      examination and laboratory data.  2.  Atrial fibrillation with uncontrolled rate, likely due to albuterol, has      been chronic over 1 year.  3.  Chronic Coumadin anticoagulation.  4.  History of dilated cardiomyopathy, could be due to rapid atrial      fibrillation.  5.  Hyperlipidemia under treatment.  6.  Obesity.   RECOMMENDATIONS:  1.  Obtain pulmonary consultation.  2.  Admit to hospital and give intravenous Diltiazem for rate control.  3.  Continue Coumadin anticoagulation.  4.  Begin Avelox.  5.  Give intravenous Solu-Medrol.  6.  Leave decision about inhalers to pulmonary physicians.      Kristine Royal   WST/MEDQ  D:  10/04/2004  T:  10/04/2004  Job:  [id number]   cc:   Peter M. Swaziland, M.D.  [address], [address]  [place], [place] [address]  Fax: [phone]   Oley Balm. Sung Amabile, M.D. [facility]   Barry Dienes. Eloise Harman, M.D.  [address]  [place]  [place] [address]  Fax: [phone]

## 2011-03-05 NOTE — Consult Note (Signed)
NAMETALLY, MCKINNON NO.:  [id number]   MEDICAL RECORD NO.:  [id number]          PATIENT TYPE:  INP   LOCATION:  5032                         FACILITY:  [facility]   PHYSICIAN:  Elmore Guise., M.D.DATE OF BIRTH:  [birth year]   DATE OF CONSULTATION:  05/06/2005  DATE OF DISCHARGE:                                   CONSULTATION   INDICATION FOR CONSULTATION:  Positive blood cultures with Staphylococcus  Aureus bacteremia.   PRIMARY CARDIOLOGIST:  Dr. Peter Swaziland.   HISTORY OF PRESENT ILLNESS:  The patient is a very pleasant 65 year old  white male with a past medical history of chronic atrial fibrillation, CHF,  asthma, was admitted for evaluation of right hip pain.  The patient stated  he was doing his normal activities, was mowing his yard over the weekend,  after he finished mowing his yard he came in and started having right thigh  and hip pain.  His pain continued to worsen throughout the weekend.  He then  went on Monday for evaluation and was unable to walk on it at that time.  He has now undergone an extensive workup including MRI of the pelvis and hip  area showing septic arthritis in the right sacroiliac joint and questionable  early abscess formation.  He did have positive blood cultures with staph  aureus bacteremia.  From a cardiac standpoint, he reports he is doing  okay.  He has had no orthopnea, no PND, reports good exercise tolerance, he  actually has taken on a job in the evenings and has lost approximately 20-30  pounds over the last six months.  He reports he has an occasional rapid  heart rate when I overdo it.  His cardiac workup has been extensive, he  had right and left heart catheterization back in 2004, when it was  discovered he had a dilated cardiomyopathy and an EF of 20-25%.  He had no  significant obstructive coronary disease by angiogram.  He was treated with  ARB and rate control as well as diuretics.  He has had significant  improvement in his ejection fraction with his last echo done back in  December of 2005 showing an EF of 40-50%.   REVIEW OF SYSTEMS:  As per HPI.  Otherwise negative.   CURRENT MEDICATIONS:  1.  Advair.  2.  Avapro 150 mg daily.  3.  Nasonex.  4.  Celebrex 200 mg daily.  5.  Coumadin.  6.  Lovenox.   ALLERGIES:  NONE.   FAMILY HISTORY:  Positive for coronary disease.   SOCIAL HISTORY:  He is married, no tobacco and no alcohol.   EXAM:  He is afebrile, temperature is 97.9, blood pressure is 100-110/70s,  heart rate is 82-115, he is sating 97% on room air.  GENERAL:  He is a very pleasant middle-aged white male, alert and oriented  x4.  He has no JVD, 2+ carotids, no bruits.  LUNGS:  Clear.  HEART:  Irregular/irregular with a soft 2/6 holosystolic murmur radiating to  the axilla.  ABDOMEN:  Soft, nontender, nondistended.  EXTREMITY:  Show no significant  edema.   LABS:  White count is 8.3, down from 13.6, hemoglobin 11.7, platelets 130,  BUN 17, creatinine 1.2, blood culture was positive for staph aureus, chest x-  ray shows no acute cardiopulmonary disease and ECG shows atrial fibrillation  rate of 111 per minute.   IMPRESSION:  1.  Staphylococcus Aureus bacteremia.  2.  Recently diagnosed septic sacroiliac joint with questionable early      abscess.  3.  History of chronic atrial fibrillation.  4.  History of nonischemic cardiomyopathy.   PLAN:  1.  To evaluate for endocarditis, I discussed with him at length the risks      and benefits of a Transesophageal echocardiogram procedure.  The patient      is willing to proceed.  We will make him n.p.o. after midnight and      schedule this for tomorrow.  2.  From a cardiovascular standpoint, he is doing very well.  He continues      to have episodic heart rates in the 110-115 range, due to benefit from      Toprol XL, I would add low-dose Toprol XL as he tolerates, would start      at 12.5 mg daily.  Please call with any  further cardiac issues.  Further      recommendations pending his transesophageal echocardiogram tomorrow.       TWK/MEDQ  D:  05/06/2005  T:  05/06/2005  Job:  [id number]   cc:   Barry Dienes. Eloise Harman, M.D.  [address]  [place]  [place] [address]  Fax: [phone]

## 2011-03-05 NOTE — H&P (Signed)
NAMEFARLEY, [name] NO.:  [id number]   MEDICAL RECORD NO.:  [id number]          PATIENT TYPE:  INP   LOCATION:  5032                         FACILITY:  [facility]   PHYSICIAN:  Barry Dienes. Eloise Harman, M.D.DATE OF BIRTH:  1946-09-12   DATE OF ADMISSION:  05/03/2005  DATE OF DISCHARGE:                                HISTORY & PHYSICAL   CHIEF COMPLAINT:  Pain in the right leg.   HISTORY OF PRESENT ILLNESS:  The patient is a 65 year old white male with  several medical problems.  He complains of 3 days of right hip, thigh, and  knee pain with any weightbearing.  He began to have mild right thigh pain  after cutting grass 3 days ago and taking a nap.  After his nap, he noted  intensity 10 out of 10 pain in the right thigh with any attempts to bear  weight.  Since that time, he has been basically at bed rest.  He has mild  right thigh pain when not weightbearing.  He denied recent cough, shortness  of breath, dysuria, frequency, abdominal pain, or diarrhea.  He is on  chronic Coumadin treatment for atrial fibrillation and a dilated  cardiomyopathy.  However, he has not had his INR level tested in several  months.   PAST MEDICAL HISTORY:  Asthma, seasonal allergic rhinitis, hypertension,  atrial fibrillation since 2004 with a cardiac workup at that time showing a  dilated cardiomyopathy with an ejection fraction in the left ventricle of  19% and a large flow defect in the inferior and apical myocardium.  In 2004,  after his abnormal Cardiolite stress test, he had a cardiac catheterization  that showed normal right heart pressures, global hypokinesia of the left  ventricle, and normal coronary artery anatomy.  He also had an  echocardiogram that showed moderate to severe mitral regurgitation.  After  treatment with ACE inhibitors, diuretics, and beta blockers, he had a follow-  up echocardiogram in 2005 that showed left ventricular ejection fraction of  30 to 35% with  normalization of left ventricular size.  In December of 2005,  he was admitted to the hospital for atrial fibrillation with a rapid  ventricular response and increased dyspnea.  Workup at that time included  pulmonary function tests that showed moderate obstructive with a mild  restrictive abnormality and mild decreasing diffusion capacity which was 75%  of predicted.  An echocardiogram showed inferior wall hypokinesia with a  left ventricular ejection fraction of 45 to 50%.  He had only mild mitral  and tricuspid insufficiency and moderate biatrial enlargement.  The  pulmonologist felt that his symptoms were primarily due to asthma at that  time.   MEDICATIONS:  1.  Albuterol metered dose inhaler two to four puffs q.i.d. p.r.n. dyspnea.  2.  Advair 250/50 one inhalation twice daily.  3.  Flonase two sprays each nostril once daily.  4.  Coumadin 5 mg daily.  5.  Lasix 40 mg daily.  6.  Diovan 80 mg daily.  7.  Spironolactone 25 mg daily.  8.  Lipitor 10 mg daily.   ALLERGIES:  ACE INHIBITORS have been associated with a cough.   PAST SURGICAL HISTORY:  Tonsillectomy, right inguinal hernia repair, and  right index finger surgery.   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  He has no  history of tobacco or alcohol abuse.  He has three children who are alive  and well.   FAMILY HISTORY:  His father died at age 40 of myocardial infarction and  hypertension.  His mother died at age 42 of myocardial infarction and liver  failure.  One brother has hypertension and one sister died of a drug  overdose.   REVIEW OF SYSTEMS:  He had fever today.  He has had no significant change in  his weight.  He has had no change in his vision.  He denies chest pain.  He  has no significant dyspnea on exertion or cough, constipation, diarrhea, or  abdominal pain.  He has severe pain in the right thigh with any  weightbearing.  He denies headache, neck pain, anxiety, depression,  or  symptoms of transient ischemic attacks.   PHYSICAL EXAMINATION:  VITAL SIGNS:  Blood pressure 138/66, pulse 68,  respirations 24, temperature 100.1 degrees Fahrenheit.  Peak expiratory flow  390 Liters per minute.  Pulse oxygen saturation 98% on room air.  GENERAL:  He is a mildly overweight white male who had pain in the right  thigh when sitting that was of moderate intensity.  HEENT:  Within normal limits.  NECK:  Supple and without jugular venous distention or carotid bruit.  CHEST:  Clear to auscultation.  HEART:  Irregularly irregular rhythm without significant murmur or gallop.  EXTREMITIES:  Without edema or cyanosis.  There is moderate tenderness to  palpation in the mid anterior thigh with some slight erythema, but no  fluctuance.  The right knee has a small effusion, but no warmth around the  knee and no pain with active and passive range of motion in the right knee.  In addition, there is no pain with passive internal and external rotation of  the right hip joint.  Both feet have normal warmth and normal pedal pulses.  ABDOMEN:  Normal bowel sounds with no hepatosplenomegaly or tenderness.  NEUROLOGY:  He is alert and oriented x3. Cranial nerves II-XII were  significant for very mild bilateral hearing loss.  He has moderate pain in  the right thigh with extension of the knee.  He is not able to fully bear  weight with moderate two-person assistance and standing.   LABORATORY DATA:  CBC showed a white blood cell count of 14.6, hemoglobin  13, hematocrit 37.2, platelets 122, 12.1 granulocytes.  PT 16.8, INR 1.3,  erythrocyte sedimentation rate 23, serum sodium 133, potassium 3.7, chloride  102, CO2 23, BUN 17, creatinine 1.2, glucose 174.  Alkaline phosphatase 38,  SGOT 20, serum albumin 3.3.   X-rays of the chest, right hip, lumbar spine, right femur, and right knee  were pending at the time of dictation.   IMPRESSION: 1.  Fever with right thigh pain.  This is  somewhat unusual.  Clinically he      does not show evidence of a septic right knee joint or right hip joint.      He may have early cellulitis of the right thigh.  Alternatively he could      have radiation of pain from the lower lumbar spine.  He does not appear      to have an abscess on  examination. Plan:  We will empirically treat him      for cellulitis with Rocephin.  I will ask the opinion of an orthopedic      consultant as to whether further imaging needs to be done.  Will have      physical therapy assess his gait.  2.  Atrial fibrillation.  His ventricular rate is well controlled on his      current regimen.  However, his INR level is quite subtherapeutic.  We      discussed the risks associated with inadequate anticoagulation.  For      now, a  pharmacy consultant will assist in finding his ideal Coumadin      dosing.  He will be covered with Lovenox until his INR is therapeutic.  3.  Idiopathic dilated cardiomyopathy.  This is likely secondary to      hypertension and has improved a great deal with adequate control of his      hypertension in the last 2 years.  4.  Hyperglycemia.  Mild and likely due to a nonfasting state.  We will      check a follow-up fasting blood glucose level, likely as an outpatient.  5.  Asthma.  Clinically well controlled on his current medical regimen.       DGP/MEDQ  D:  05/04/2005  T:  05/04/2005  Job:  [id number]   cc:   Peter M. [place], M.D.  [address], [address]  [place], [place] [address]  Fax: [phone]   Oley Balm. Sung Amabile, M.D. [facility]

## 2011-03-05 NOTE — Cardiovascular Report (Signed)
[name], [name] NO.:  [id number]   MEDICAL RECORD NO.:  [id number]                   PATIENT TYPE:  OIB   LOCATION:  2852                                 FACILITY:  [facility]   PHYSICIAN:  Peter M. Sw[place]il[place], M.D.               DATE OF BIRTH:  1946/07/16   DATE OF PROCEDURE:  09/24/2003  DATE OF DISCHARGE:  09/24/2003                              CARDIAC CATHETERIZATION   INDICATION FOR PROCEDURE:  The patient is a 65 year old male who presented  with newly diagnosed atrial fibrillation.  Subsequent evaluation  demonstrated severe left ventricular dysfunction by both echocardiogram and  Cardiolite study.  Cardiolite study suggested a large inferior wall fixed  defect.   PROCEDURE:  1. Right and left heart catheterization.  2. Coronary and ventricular angiography.   ACCESS:  Via the right femoral artery and vein using standard Seldinger  technique.   EQUIPMENT:  6-French 4 cm right and left Judkins catheter, 6-French pigtail  catheter, 6-French arterial sheath, 8-French venous sheath, 7-French balloon  tip Swan-Ganz catheter.   CONTRAST:  120 mL of Omnipaque.   MEDICATIONS:  Local anesthesia 1% Xylocaine.   HEMODYNAMIC DATA:  It is noted that the right heart catheterization was  difficult due to enlarged right side.  It was very difficult to get a good  wedge pressure.  The right atrial pressure was 11/10 with a mean of 9 mmHg.  Right ventricular pressure is 33 with an EDP of 8 mmHg.  Pulmonary artery  pressure is 32/18 with a mean of 25 mmHg.  Mean pulmonary capillary wedge  pressure is approximately 20 mmHg.  Aortic pressure is 128/97 with a mean of  112 mmHg.  Left ventricular pressure is 123 with an EDP of 16 mmHg.  There  is no significant aortic or mitral valve gradient.  Cardiac output by  thermodilution is 3.8 L/minute with an index of 1.65.  By thermodilution,  cardiac output is 5.6 with an index of 2.42.   ANGIOGRAPHIC DATA:  1.  Left ventricular angiography performed in the RAO view demonstrates left     ventricular enlargement with severe global hypokinesia.  Ejection     fraction is estimated at 20%.  There is 2+ mitral insufficiency.  2. The left coronary artery arises and distributes normally.  It is a very     large vessel.  The left main coronary artery is normal.  3. Left anterior descending artery and its branches are normal.  4. The left circumflex coronary artery and its branches are normal.  5. The right coronary artery appears normal.   FINAL INTERPRETATION:  1. Normal coronary anatomy.  2. Severe global left ventricular dysfunction.  3. 2+ mitral insufficiency.  4. Upper normal right heart pressures.  Peter M. Swazil[place], M.D.    PMJ/MEDQ  D:  09/25/2003  T:  09/25/2003  Job:  [id number]   cc:   Barry Dienes. Eloise Harman, M.D.  [address]  [place]  [place] [address]  Fax: [phone]

## 2011-03-05 NOTE — H&P (Signed)
NAMENAZEER, ROMNEY NO.:  [id number]   MEDICAL RECORD NO.:  [id number]                   PATIENT TYPE:  OIB   LOCATION:                                       FACILITY:  [facility]   PHYSICIAN:  Peter M. [place]iland, M.D.               DATE OF BIRTH:  1946/08/11   DATE OF ADMISSION:  09/24/2003  DATE OF DISCHARGE:                                HISTORY & PHYSICAL   HISTORY OF PRESENT ILLNESS:  Mr. [name] is a 66 year old male who was  initially seen for evaluation of new onset of atrial fibrillation and rapid  ventricular response. In mid October, he had had an episode of chest pain  and shortness of breath when he was going to bed. This was associated with  pressure in his chest. This kept him up most of the night. He had noted  similar symptoms in the past when he mowed his lawn. He attributed this in  the past to asthma; however, his symptoms did not abate. He was seen by Dr.  Eloise Harman who subsequently found the patient to be in atrial fibrillation  with rapid ventricular response. He was started on verapamil and Coumadin.  He previously had been taking Accupril, but apparently this was stopped when  the Calan was started. He still had symptoms of shortness of breath with  exertion. He had no prior known history of cardiac disease. He has had a  long standing history of asthma and hypertension. On further evaluation, he  had an adenosine Cardiolite study which showed a large fixed defect  involving the inferior wall and Apex consistent with scar. He has severe  left ventricular enlargement with severe left ventricular dysfunction and  ejection fraction of 19%. An echocardiogram also confirmed severe left  ventricular enlargement with end-diastolic volume of 74 mm. Ejection  fraction was 20 to 25%. He has global hypokinesia with moderate to severe  mitral insufficiency and moderate bi-atrial enlargement. Because of these  findings indicating a high risk, it  was recommended he undergone cardiac  catheterization to further assess his hemodynamics and coronary arteries.   PAST MEDICAL HISTORY:  1. Asthma.  2. Hypertension.  3. Status post hernia repair.  4. Previous tendon repair in his right index finger.  5. Tonsillectomy.  6. No known history of diabetes or hypercholesterolemia. The patient is a     nonsmoker.   ALLERGIES:  No known drug allergies.   CURRENT MEDICATIONS:  1. Advair 250/50 mg b.i.d.  2. Flonase daily.  3. Coumadin 7.5 mg three days a week and 5 mg four days a week.  4. Now back on Accupril at 20 mg per day.  5. Toprol-XL 100 mg per day.  6. Lipitor 10 mg per day.   SOCIAL HISTORY:  The patient works for [employer]. He denies tobacco use.  He rarely drinks alcohol. He drinks three cups of coffee  per day. He is  married and has three children.   FAMILY HISTORY:  Father died at age 98 of a myocardial infarction and had  hypertension. Mother died age 88 of myocardial infarction, also had liver  failure. One brother has hypertension. One sister died with overdose.   REVIEW OF SYSTEMS:  Otherwise unremarkable. He has had no dizziness or  syncope. No history of TIA or claudication. No bowel or bladder complaints.   PHYSICAL EXAMINATION:  GENERAL:  The patient is an overweight white male in  no distress.  VITAL SIGNS:  Weight 238, blood pressure 146/90, pulse 70 and irregular.  HEENT:  Pupils are equal, round, and reactive to light and accommodation.  Extraocular movements are full. Oropharynx is clear.  NECK:  Supple without JVD, adenopathy, thyromegaly, or bruits.  LUNGS:  Clear.  CARDIAC:  Reveals irregular rate and rhythm without gallop. There is a soft,  grade 1/6 systolic murmur heard at the left sternal border and apex.  ABDOMEN:  Soft and nontender, is obese. He has no masses or  hepatosplenomegaly, and there are no bruits. Femoral and pedal pulses are 2+  and symmetric. He has no edema.  NEUROLOGICAL:   Nonfocal.   LABORATORY DATA:  Chest x-ray showed cardiomegaly but no active disease. His  prior ECG showed atrial fibrillation with nonspecific T-wave abnormality.   IMPRESSION:  1. Atrial fibrillation, new onset.  2. Severe left ventricular dysfunction, probably ischemic.  3. 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  4. Hypertension.  5. Hypercholesterolemia.  6. Family history of early coronary disease.   PLAN:  Will admit for right and left heart catheterization and coronary  angiography.                                                Peter M. Swaziland, M.D.    PMJ/MEDQ  D:  09/18/2003  T:  09/18/2003  Job:  [id number]   cc:   Barry Dienes. Eloise Harman, M.D.  [address]  [place]  [place] [address]  Fax: [phone]

## 2011-03-05 NOTE — Consult Note (Signed)
[name], [name] NO.:  [id number]   MEDICAL RECORD NO.:  [id number]          PATIENT TYPE:  INP   LOCATION:  3713                         FACILITY:  [facility]   PHYSICIAN:  Oley Balm. Sung Amabile, M.D. [facility] OF BIRTH:  05/03/1946   DATE OF CONSULTATION:  10/04/2004  DATE OF DISCHARGE:                                   CONSULTATION   REQUESTING PHYSICIAN:  Georga Hacking, M.D.   REASON FOR CONSULTATION:  Episodic dyspnea.   HISTORY OF PRESENT ILLNESS:  Mr. [name] is a 65 year old gentleman  initially diagnosed with asthma 10 years ago when he lived in [place].  Over the past several months he has had spells of dyspnea which come on  gradually over hours and seem to be triggered by changes in the weather and  stress.  Associated with these spells he has chest discomfort in the  subxiphoid area and cough productive of white to yellow mucous.  He has had  occasional hemoptysis.  He denies flushing.  He gets diaphoretic with  vigorous coughing but otherwise no significant diaphoresis.  He denies lower  extremity edema and calf tenderness.  These spells have been treated as  asthma exacerbations with steroids and antibiotics which provide him with  some apparent benefit, though he has gotten over some of these spells  without medical intervention.  When he uses his albuterol he gets little  relief of his shortness of breath.  He is also maintained on Advair.  His  care over the last couple of months has been under the guidance of Dr. Brunilda Payor.  He did have a pulmonologist in [place] but does not see one  here in [place].  He has had a pulmonary function test performed in [place] and reports that they were abnormal, but otherwise cannot define this  abnormality any further.  He denies fever, chills and sweats.  There is no  pleuritic chest pain.  He presented to the [facility] with  symptoms of exertional dyspnea and was found to have  rapid atrial  fibrillation.  He was transferred to [facility] emergency  department for further evaluation and has now been evaluated by Dr. Donnie Aho  for the rapid atrial fibrillation now.   PAST MEDICAL HISTORY:  1.  Asthma as above.  2.  Chronic atrial fibrillation.   MEDICATIONS:  Metoprolol, Diovan, Coumadin, Advair, p.r.n. albuterol,  aspirin.   SOCIAL HISTORY:  Never smoked.   FAMILY HISTORY:  Noncontributory.   REVIEW OF SYSTEMS:  As per the history of present illness.  In addition, he  denies generalized fatigue, weight loss, adenopathy, nausea, vomiting,  diarrhea and dysuria.   PHYSICAL EXAMINATION:  Afebrile.  Blood pressure normal.  Heart rate is 120  and irregular, respirations are 16 and unlabored.  Room air oxygen  saturations normal.  In general, he appears older than stated age.  HEAD & NECK EXAM:  Reveals no acute abnormalities.  There is no adenopathy  or jugular venous distention.  CHEST  EXAMINATION:  Reveals a normal percussion throughout.  Breath  sounds  are full without wheezes or other adventitious sounds noted.  CARDIAC EXAM:  Reveals an irregularly irregular rhythm with no murmurs,  gallops or rubs.  ABDOMEN:  Soft, nontender with normal bowel sounds.  EXTREMITIES:  Without clubbing, cyanosis and edema.  NEUROLOGIC EXAM:  Without focal deficits.   DATA:  B-natriuretic peptide is normal at 68.  Prothrombin time is 21.6 with  an INR of 2.4.  Chemistry panel is essentially unremarkable.  CBC is normal.  Chest x-ray reveals no acute cardiac or pulmonary disease.   IMPRESSION:  1.  Episodic dyspnea - history is atypical for asthma.  However, he does      have a reported ALLERGY TO [employer]IMALS and has cats within the home.  The      report of problems with changes in the weather and the presence of      mucous would be consistent with asthma.  Atypical features include      nonresponse to albuterol and an inability to control this problem  with      Advair, which he assures me that he is compliant with.  It is possible      that he has exacerbating features, making the asthma difficult to      control.  Possible exacerbating factors could include sinusitis and      gastroesophageal reflux disease.  In fact, based on the subxiphoid      discomfort, I suspect he does indeed have gastroesophageal reflux      disease.  2.  Chronic atrial fibrillation - rate is poorly controlled despite a      diltiazem drip.  Although his B-natriuretic peptide is normal, I would      consider that poorly controlled atrial fibrillation could indeed account      for his symptoms of shortness of breath and exercise intolerance.  A      cardiopulmonary stress test might be warranted in the future.   PLAN:  For now, we will continue him on Advair and albuterol as needed.  I  want to avoid systemic steroids and antibiotics at the present time.  I will  put him empirically on acid suppression therapy with a proton pump  inhibitor.  A CT scan of the sinuses will be ordered.  Full pulmonary  function tests will be ordered.  After discharge, a cardiopulmonary stress  test can be considered.       DBS/MEDQ  D:  10/04/2004  T:  10/05/2004  Job:  [id number]   cc:   Barry Dienes. Eloise Harman, M.D.  [address]  [place]  [place] [address]  Fax: [phone]   W. Ashley Royalty., M.D.  [address], [address]  [place]  [place] [address]  Fax: [phone]

## 2011-03-05 NOTE — H&P (Signed)
[name], PIEPER NO.:  [id number]   MEDICAL RECORD NO.:  [id number]          PATIENT TYPE:  EMS   LOCATION:  MAJO                         FACILITY:  [facility]   PHYSICIAN:  Barry Dienes. Eloise Harman, M.D.DATE OF BIRTH:  [birth year]   DATE OF ADMISSION:  10/16/2006  DATE OF DISCHARGE:                              HISTORY & PHYSICAL   CHIEF COMPLAINT:  Chest pain.   HISTORY OF PRESENT ILLNESS:  The patient is a 65 year old white male  with several medical problems who complained of several days of  worsening dry cough with occasional dry heaves.  He did not have recent  fever or chills.  He has had the gradual onset of class 2 to 3 dyspnea,  particularly with climbing stairs.  Today, at approximately 3 p.m., he  had approximately 10 minutes of mild left lobe parasternal aching and  pain with left shoulder pain.  It was not associated with nausea or  diaphoresis.  The discomfort resolved on its own.  This prompted him to  come to the emergency room for evaluation.  In the emergency room, he  was treated with nebulizers and prednisone 60 mg.  Already, several  hours later, his cough and shortness of breath have improved somewhat.  He has not had recurrent substernal chest pain in the emergency room.  He had a Cardiolite exercise test in approximately October of 2006.  It  showed no evidence of acute ischemia.   PAST MEDICAL HISTORY:  Otherwise significant for chronic asthma, chronic  atrial fibrillation, congestive heart failure with diastolic  dysfunction.  In 2005, echocardiogram that showed left ventricular  ejection fraction of 45 to 50% with inferior wall hypokinesia, mild  mitral and tricuspid regurgitation, and moderate biatrial enlargement.  He has also had pulmonary function tests that showed moderate  obstructive defect with mild restrictive abnormality and mildly  decreased perfusion capacity which was 75% of predicted.  He also has a  history of  hyperlipidemia, a vague history of coronary artery disease,  and 2004 cardiac catheterization that showed global hypokinesia of the  left ventricle and normal coronary anatomy.  He also has a history of  seasonal allergic rhinitis and chronic anticoagulation for atrial  fibrillation.   MEDICATIONS PRIOR TO ADMISSION:  1. Nasonex 1 to 2 sprays each nostril once daily.  2. Lasix 40 mg daily.  3. Coumadin 7.5 mg daily.  4. Xopenex 45 mcg inhaled t.i.d.  5. Advair 50/250 one inhalation twice daily.  6. Theo-Dur 200 mg daily.  7. Spiriva 18 mcg once daily.  8. Toprol XL 12.5 mg daily.  9. Cardizem CD 240 mg daily.  10.Protonix 40 mg daily.  11.Lipitor 10 mg daily.   ALLERGIES:  ACE INHIBITORS have been associated with cough.   PAST SURGICAL HISTORY:  1. Tonsillectomy and adenoidectomy.  2. Right inguinal hernia repair.  3. Right index finger surgical repair, July of 2006.  4. Right SI joint methicillin-sensitive Staphylococcus aureus      infection.   FAMILY HISTORY:  Mother died at age 56 of myocardial infarction;  she  also had some liver disease.  His father died at age 69 of myocardial  infarction; he also had a history of hypertension.  His sister had died  of a drug overdose several years ago and a brother has hypertension.   SOCIAL HISTORY:  He is married and has 3 adult children.  He has no  history of tobacco or alcohol abuse.  Currently, he is working with a  Paramedic.   REVIEW OF SYSTEMS:  He denies recent fever or chills, nausea, vomiting,  diarrhea, constipation, BPH symptoms, anxiety, or depression.  He has  chronic mild insomnia where it is difficult for him to fall asleep.   VITAL SIGNS:  Blood pressure 143/93, pulse 78, respirations 26,  temperature 97.8, pulse oxygen saturation of 94% on room air.  GENERAL:  He is a mildly overweight white male who is in no apparent  distress while sitting approximately upright on an emergency room  gurney.  HEAD,  EYE, EAR, NOSE AND THROAT EXAM:  Within normal limits with the  exception of mild rhinitis.  NECK:  Supple without jugular venous distention or carotid bruit.  CHEST:  Bilateral scattered wheezing throughout both lungs.  HEART:  Irregularly irregular rhythm.  S1 and S2 were present without  significant murmur or gallop.  ABDOMEN:  Benign.  EXTREMITIES:  Without cyanosis, clubbing or edema.  NEUROLOGICAL EXAM:  Nonfocal.   LABORATORY STUDIES:  Serum sodium 137, potassium 3.2, chloride 104, BUN  12, creatinine 1.0, INR 3.1, CKMB less than 1.0, Troponin I was less  than 0.5.  A D-dimer was 1.28 (0.48).  EKG showed atrial fibrillation  with nonspecific ST segment abnormalities.  Chest x-ray showed mild  kyphosis with no acute cardiopulmonary disease.   IMPRESSION AND PLAN:  1. Chest pain:  It is most likely due to a musculoskeletal etiology      from recent from recent exacerbation of chronic obstructive      pulmonary disease (COPD) and  coughing.  Coronary artery disease      with non-Q wave myocardial infarction or unstable angina:  It is      somewhat less likely.  Gastroesophageal reflux disease could cause      similar symptoms.  I plan to check serial cardiac isoenzymes to      rule out the possibility of a non-Q wave myocardial infarction or      unstable angina.  If his enzymes are negative, further workup could      be pursued as an outpatient.  2. Hypokalemia:  This is likely secondary to chronic Lasix treatment.      We will add a angiotensin receptor blocker, Diovan to his regimen.  3. Dyspnea:  Secondary to chronic obstructive pulmonary disease      exacerbation versus congestive heart failure.  We will check a BNP      test and continue Solu-Medrol, frequent nebulizers, and use of a      flutter valve.           ______________________________  Barry Dienes. Eloise Harman, M.D.    DGP/MEDQ  D:  10/16/2006  T:  10/17/2006  Job:  [id number]   cc:   Peter M. Swaziland, M.D.  Nadara Mustard, MD  Oley Balm Sung Amabile, MD

## 2011-03-05 NOTE — Discharge Summary (Signed)
NAMEALANZO, LAMB NO.:  [id number]   MEDICAL RECORD NO.:  [id number]          PATIENT TYPE:  INP   LOCATION:  3713                         FACILITY:  [facility]   PHYSICIAN:  Peter M. Swaziland, M.D.  DATE OF BIRTH:  1946-06-23   DATE OF ADMISSION:  10/04/2004  DATE OF DISCHARGE:  10/06/2004                                 DISCHARGE SUMMARY   HISTORY OF PRESENT ILLNESS:  Mr. Bonsell is a 65 year old white male who was  seen for evaluation of atrial fibrillation with rapid ventricular response  and increased dyspnea.  Patient has a known history of asthma and  nonischemic cardiomyopathy.  At time of his initial diagnosis he was in  atrial fibrillation and this has been managed with rate control and  anticoagulation.  He had a Cardiolite stress test which showed an ejection  fraction of 19% and a large fixed inferior and apical defect.  Subsequent  cardiac catheterization demonstrated normal right heart pressures.  His left  ventricle was dilated with global hypokinesia.  He had normal coronary  anatomy. Echocardiogram at that time showed marked left ventricular  enlargement with ejection fraction of 20-25%.  He had moderate to severe  mitral insufficiency at that time and estimated right ventricular systolic  pressure 45 mmHg.  He was treated medically with ACE inhibitors, diuretics,  and Coreg.  Subsequent follow-up in June of this year showed improvement in  ejection fraction by echocardiogram to 30-35% with normalization of left  ventricular size.  There was also normal estimated right ventricular  systolic pressure and diminution of his mitral insufficiency.  Since that  time the patient has had multiple exacerbations of what is felt to be  asthma.  He was treated with intermittent antibiotics as well as steroid  inhalers.  His Coreg was switched to Toprol and he was taken off ACE  inhibitors and placed on Diovan.  Over the past few days he has had  increased  dyspnea associated with cough, but no increased edema.  He was  found to be in atrial fibrillation which temporally appeared to be related  to use of albuterol inhaler.  He was admitted for further management.  For  details of his past medical history, social history, family history, and  physical examination, please see admission history and physical.   LABORATORY DATA:  Chest x-ray showed mild cardiomegaly with no active  disease.  His sodium was 140, potassium 4.0, chloride 111, CO2 25, BUN 13,  creatinine 0.8, glucose 115, total bilirubin 1.4.  All other LFTs were  normal.  Prothrombin time was 21.6 with an INR of 2.4.  White count was  7300, hemoglobin 15, hematocrit 43.4, platelets 206,000.  B-natriuretic  peptide level is 67.7.  ECG showed atrial fibrillation, ventricular response  of 131.  He had nonspecific ST-T wave abnormality.   HOSPITAL COURSE:  The patient was admitted to telemetry.  He was initially  placed on IV Cardizem with subsequent excellent rate control.  The following  morning he was switched to oral Diltiazem and had consistent excellent rate  control throughout the remainder of his  hospital stay.  He was continued on  Toprol XL and his ARB.  He was seen in consultation by Dr. Billy Fischer.  He recommended CT of the sinuses which was negative.  Pulmonary function  studies were obtained.  This demonstrated moderate obstructive deficit.  His  FVC was 3.34L which was 71% of reference.  FEV1 was 1.85 L which was 55% of  predicted and the FEF 25-75% was 0.65 which was 20% of predicted.  He had  mild restrictive abnormality and mild decrease in diffusion capacity with a  DLCO of 20.3 which was 75% of predicted.  Patient also had a follow-up  echocardiogram which showed significant improvement in his left ventricular  function.  He did have some hypokinesia of the basilar inferior wall with  overall ejection fraction of 45-50%.  Left ventricular volumes were normal.  He  had only mild mitral and tricuspid insufficiency and had moderate  biatrial enlargement.  Based on his echocardiogram findings and his normal  BNP it was felt that his symptoms of dyspnea were predominantly pulmonary  related.  Dr. Sung Amabile had recommended increasing his Advair to 250/50 b.i.d.  and using albuterol with a spacer as needed.  He recommended trial of proton-  pump inhibitor.  He felt that systemic steroids or antibiotics were not  indicated at this point.  Patient did improve with his dyspnea and was  discharged home in stable condition on October 06, 2004.   DISCHARGE DIAGNOSES:  1.  Dyspnea secondary to obstructive lung disease/asthma.  2.  Dilated nonischemic cardiomyopathy with significant improvement in left      ventricular dysfunction.  3.  Hypertension.  4.  Hyperlipidemia.  5.  Atrial fibrillation with rapid ventricular response now under good      control.  6.  Anticoagulation.   DISCHARGE MEDICATIONS:  1.  Diovan 80 mg daily.  2.  Coumadin 5 mg daily.  3.  Toprol XL 100 mg daily.  4.  Advair 250/50 Diskus one puff b.i.d.  5.  Protonix 40 mg daily.  6.  Diltiazem SR 180 mg daily.  7.  Albuterol 90 mcg inhaler with spacer q.4h. as needed.  8.  Lasix 40 mg daily.  9.  Aldactone 25 mg daily.  10. Lipitor Monday, Wednesday, Friday.   Patient is to continue a low sodium diet.  He will follow up with Dr. Swaziland  in six months.  Follow-up arrangements with Dr. Sung Amabile are pending at time  of discharge.   DISCHARGE STATUS:  Improved.       PMJ/MEDQ  D:  10/06/2004  T:  10/06/2004  Job:  [id number]   cc:   Barry Dienes. Eloise Harman, M.D.  [address]  [place]  [place] [address]  Fax: [phone]   Oley Balm. Sung Amabile, M.D. [facility]

## 2011-06-19 DIAGNOSIS — I495 Sick sinus syndrome: Secondary | ICD-10-CM

## 2011-06-19 HISTORY — DX: Sick sinus syndrome: I49.5

## 2011-07-12 ENCOUNTER — Encounter (HOSPITAL_BASED_OUTPATIENT_CLINIC_OR_DEPARTMENT_OTHER): Payer: Self-pay

## 2011-07-12 ENCOUNTER — Other Ambulatory Visit: Payer: Self-pay

## 2011-07-12 ENCOUNTER — Emergency Department (INDEPENDENT_AMBULATORY_CARE_PROVIDER_SITE_OTHER): Payer: BC Managed Care – PPO

## 2011-07-12 ENCOUNTER — Emergency Department (HOSPITAL_BASED_OUTPATIENT_CLINIC_OR_DEPARTMENT_OTHER): Payer: BC Managed Care – PPO

## 2011-07-12 ENCOUNTER — Emergency Department (HOSPITAL_BASED_OUTPATIENT_CLINIC_OR_DEPARTMENT_OTHER)
Admission: EM | Admit: 2011-07-12 | Discharge: 2011-07-12 | Disposition: A | Discharge status: Home / Self Care | Payer: BC Managed Care – PPO | Attending: Emergency Medicine | Admitting: Emergency Medicine

## 2011-07-12 DIAGNOSIS — I10 Essential (primary) hypertension: Secondary | ICD-10-CM | POA: Insufficient documentation

## 2011-07-12 DIAGNOSIS — R42 Dizziness and giddiness: Secondary | ICD-10-CM

## 2011-07-12 DIAGNOSIS — I509 Heart failure, unspecified: Secondary | ICD-10-CM | POA: Insufficient documentation

## 2011-07-12 DIAGNOSIS — I252 Old myocardial infarction: Secondary | ICD-10-CM | POA: Insufficient documentation

## 2011-07-12 DIAGNOSIS — R0789 Other chest pain: Secondary | ICD-10-CM

## 2011-07-12 DIAGNOSIS — E119 Type 2 diabetes mellitus without complications: Secondary | ICD-10-CM | POA: Insufficient documentation

## 2011-07-12 DIAGNOSIS — J45909 Unspecified asthma, uncomplicated: Secondary | ICD-10-CM | POA: Insufficient documentation

## 2011-07-12 DIAGNOSIS — I4891 Unspecified atrial fibrillation: Secondary | ICD-10-CM | POA: Insufficient documentation

## 2011-07-12 DIAGNOSIS — R079 Chest pain, unspecified: Secondary | ICD-10-CM | POA: Insufficient documentation

## 2011-07-12 DIAGNOSIS — Z79899 Other long term (current) drug therapy: Secondary | ICD-10-CM | POA: Insufficient documentation

## 2011-07-12 LAB — BASIC METABOLIC PANEL
CO2: 19 mEq/L (ref 19–32)
Calcium: 9.4 mg/dL (ref 8.4–10.5)
Creatinine, Ser: 1.1 mg/dL (ref 0.50–1.35)
Glucose, Bld: 117 mg/dL — ABNORMAL HIGH (ref 70–99)

## 2011-07-12 LAB — CBC
HCT: 43.7 % (ref 39.0–52.0)
Hemoglobin: 15.1 g/dL (ref 13.0–17.0)
MCH: 30.4 pg (ref 26.0–34.0)
MCV: 88.1 fL (ref 78.0–100.0)
RBC: 4.96 MIL/uL (ref 4.22–5.81)

## 2011-07-12 LAB — PROTIME-INR
INR: 4.65 — ABNORMAL HIGH (ref 0.00–1.49)
Prothrombin Time: 44.6 seconds — ABNORMAL HIGH (ref 11.6–15.2)

## 2011-07-12 NOTE — ED Notes (Signed)
Dr. Rubin Payor speaking with cardiologist.

## 2011-07-12 NOTE — ED Notes (Signed)
Pt reports onset of sharp left chest wall pain that occurred yesterday and continued today.  Started while he was resting and pain is described as discomfort associated with "light-headedness".

## 2011-07-12 NOTE — ED Provider Notes (Signed)
History     CSN: [id number] Arrival date & time: 07/12/2011 10:42 AM  Chief Complaint  Patient presents with  . Chest Pain    HPI  (Consider location/radiation/quality/duration/timing/severity/associated sxs/prior treatment)  Patient is a 65 y.o. male presenting with chest pain. The history is provided by the patient.  Chest Pain The chest pain began yesterday. The chest pain is resolved. The quality of the pain is described as sharp. The pain does not radiate. Pertinent negatives for primary symptoms include no fever, no fatigue, no shortness of breath, no abdominal pain, no nausea and no vomiting.  Pertinent negatives for associated symptoms include no numbness and no weakness.   patient had sharp left sided chest pain yesterday. Resolved. Today at work he states that he just felt bad. No further chest pain. Mild nausea. He now feels better. He states taht this does not feel like his previous heart attack.   Past Medical History  Diagnosis Date  . Atrial fibrillation     chronic  . CHF (congestive heart failure)     chronic systolic dysfunction  . Diabetes mellitus     type 2  . Mitral insufficiency     moderate  . Asthma   . Chronic anticoagulation   . HTN (hypertension)   . Myocardial infarct   . Atrial fibrillation     Past Surgical History  Procedure Date  . Tonsillectomy   . Inguinal hernia repair     right  . Repair of right index finger   . Surgery for an infected right sacroiliac joint   . Cardiac catheterization     Family History  Problem Relation Age of Onset  . Heart disease Mother   . Heart disease Father     History  Substance Use Topics  . Smoking status: Never Smoker   . Smokeless tobacco: Never Used  . Alcohol Use: No      Review of Systems  Review of Systems  Constitutional: Negative for fever, activity change, appetite change and fatigue.  HENT: Negative for neck stiffness.   Eyes: Negative for pain.  Respiratory: Negative for  chest tightness and shortness of breath.   Cardiovascular: Positive for chest pain. Negative for leg swelling.  Gastrointestinal: Negative for nausea, vomiting, abdominal pain and diarrhea.  Genitourinary: Negative for flank pain.  Musculoskeletal: Negative for back pain.  Skin: Negative for rash.  Neurological: Positive for light-headedness. Negative for weakness, numbness and headaches.  Psychiatric/Behavioral: Negative for behavioral problems.    Allergies  Nitroglycerin; Ace inhibitors; and Beta adrenergic blockers  Home Medications   Current Outpatient Rx  Name Route Sig Dispense Refill  . BECLOMETHASONE DIPROPIONATE 40 MCG/ACT IN AERS Inhalation Inhale 2 puffs into the lungs 2 (two) times daily.      . ROFLUMILAST 500 MCG PO TABS Oral Take 500 mcg by mouth daily.      . ALBUTEROL SULFATE (5 MG/ML) 0.5% IN NEBU Nebulization Take 2.5 mg by nebulization as needed.      . ASPIRIN 81 MG PO TABS Oral Take 81 mg by mouth daily.      . BUDESONIDE 0.25 MG/2ML IN SUSP Nebulization Take 0.25 mg by nebulization as needed.      Marland Kitchen DILTIAZEM HCL COATED BEADS 300 MG PO CP24 Oral Take 300 mg by mouth daily.      Marland Kitchen FLUTICASONE-SALMETEROL 250-50 MCG/DOSE IN AEPB Inhalation Inhale 1 puff into the lungs every 12 (twelve) hours.      . FUROSEMIDE 40 MG PO TABS  Oral Take 40 mg by mouth daily.      Marland Kitchen METOCLOPRAMIDE HCL 5 MG PO TABS Oral Take 5 mg by mouth daily.      Marland Kitchen OLMESARTAN MEDOXOMIL 40 MG PO TABS Oral Take 40 mg by mouth daily.      . WARFARIN SODIUM 5 MG PO TABS Oral Take 5 mg by mouth daily.        Physical Exam    BP 96/66  Pulse 72  Temp(Src) 97.5 F (36.4 C) (Oral)  Resp 18  Ht 6\' 1"  (1.854 m)  Wt 226 lb (102.513 kg)  BMI 29.82 kg/m2  SpO2 97%  Physical Exam  Nursing note and vitals reviewed. Constitutional: He is oriented to person, place, and time. He appears well-developed and well-nourished.  HENT:  Head: Normocephalic and atraumatic.  Eyes: EOM are normal. Pupils are  equal, round, and reactive to light.  Neck: Normal range of motion. Neck supple.  Cardiovascular: Normal rate and normal heart sounds.   No murmur heard.      Irregular.  Pulmonary/Chest: Effort normal and breath sounds normal.  Abdominal: Soft. Bowel sounds are normal. He exhibits no distension and no mass. There is no tenderness. There is no rebound and no guarding.  Musculoskeletal: Normal range of motion. He exhibits no edema.  Neurological: He is alert and oriented to person, place, and time. No cranial nerve deficit.  Skin: Skin is warm and dry.  Psychiatric: He has a normal mood and affect.    ED Course  Procedures (including critical care time)   Labs Reviewed  CBC  BASIC METABOLIC PANEL  CARDIAC PANEL(CRET KIN+CKTOT+MB+TROPI)  BRAIN NATRIURETIC PEPTIDE  PROTIME-INR   Results for orders placed during the hospital encounter of 07/12/11  CBC      Component Value Range   WBC 9.3  4.0 - 10.5 (K/uL)   RBC 4.96  4.22 - 5.81 (MIL/uL)   Hemoglobin 15.1  13.0 - 17.0 (g/dL)   HCT 91.4  78.2 - 95.6 (%)   MCV 88.1  78.0 - 100.0 (fL)   MCH 30.4  26.0 - 34.0 (pg)   MCHC 34.6  30.0 - 36.0 (g/dL)   RDW 21.3  08.6 - 57.8 (%)   Platelets 173  150 - 400 (K/uL)  BASIC METABOLIC PANEL      Component Value Range   Sodium 137  135 - 145 (mEq/L)   Potassium 4.0  3.5 - 5.1 (mEq/L)   Chloride 103  96 - 112 (mEq/L)   CO2 19  19 - 32 (mEq/L)   Glucose, Bld 117 (*) 70 - 99 (mg/dL)   BUN 24 (*) 6 - 23 (mg/dL)   Creatinine, Ser 4.69  0.50 - 1.35 (mg/dL)   Calcium 9.4  8.4 - 62.9 (mg/dL)   GFR calc non Af Amer >60  >60 (mL/min)   GFR calc Af Amer >60  >60 (mL/min)  CARDIAC PANEL(CRET KIN+CKTOT+MB+TROPI)      Component Value Range   Total CK 140  7 - 232 (U/L)   CK, MB 4.5 (*) 0.3 - 4.0 (ng/mL)   Troponin I <0.30  <0.30 (ng/mL)   Relative Index 3.2 (*) 0.0 - 2.5   PROTIME-INR      Component Value Range   Prothrombin Time 44.6 (*) 11.6 - 15.2 (seconds)   INR 4.65 (*) 0.00 - 1.49     Dg Chest 2 View  07/12/2011  *RADIOLOGY REPORT*  Clinical Data: Left chest pain.  Dizziness.  CHEST - 2  VIEW  Comparison: 08/06/2008.  Findings: Trachea is midline.  Heart is mildly enlarged, stable. Probable bibasilar scarring, right greater than left.  No airspace consolidation or pleural fluid.  IMPRESSION: No acute findings.  Original Report Authenticated By: Reyes Ivan, M.D.      Date: 07/12/2011  Rate: 91  Rhythm: atrial fibrillation  QRS Axis: normal  Intervals: atrial fibrilation  ST/T Wave abnormalities: nonspecific ST/T changes  Conduction Disutrbances:atrial fibrilation  Narrative Interpretation:   Old EKG Reviewed: changes noted    No diagnosis found.   MDM Chest pain and weakness. Labs reassuring. INR mildly elevated. Non-specific EKG changes. D/w Dr Swaziland. Will see on office.         Juliet Rude. Rubin Payor, MD 07/12/11 1355

## 2011-07-13 ENCOUNTER — Ambulatory Visit (INDEPENDENT_AMBULATORY_CARE_PROVIDER_SITE_OTHER): Payer: BC Managed Care – PPO | Admitting: Nurse Practitioner

## 2011-07-13 ENCOUNTER — Encounter: Payer: Self-pay | Admitting: Nurse Practitioner

## 2011-07-13 VITALS — BP 105/60 | HR 96 | Ht 73.0 in | Wt 223.2 lb

## 2011-07-13 DIAGNOSIS — I5020 Unspecified systolic (congestive) heart failure: Secondary | ICD-10-CM | POA: Insufficient documentation

## 2011-07-13 DIAGNOSIS — I4891 Unspecified atrial fibrillation: Secondary | ICD-10-CM

## 2011-07-13 DIAGNOSIS — R42 Dizziness and giddiness: Secondary | ICD-10-CM | POA: Insufficient documentation

## 2011-07-13 NOTE — Assessment & Plan Note (Signed)
Will place a 24 holter. He does have chronic atrial fib.

## 2011-07-13 NOTE — Patient Instructions (Signed)
We are going to put a heart monitor on you for the next 24 hours We are going to get an ultrasound of heart to look at your pumping I will see you back a few days after the echo to discuss. Stay on your current medicines for now. We may need to adjust them when I see you back.

## 2011-07-13 NOTE — Assessment & Plan Note (Signed)
Last EF was 35 to 40%. He never got his updated echo so we will arrange for repeat study. He is on ARB. May need to add digoxin. He had normal coronary anatomy back in 2004. His sharp chest pain doesn't seem cardiac related. We will see what the echo shows. I will see him back to discuss and adjust medicines as needed. Patient is agreeable to this plan and will call if any problems develop in the interim.

## 2011-07-13 NOTE — Progress Notes (Signed)
[name] Date of Birth: October 08, 1946   History of Present Illness: [name] is seen back today for a follow up visit. He is seen for Dr. Swaziland. He has not been here in over one year. He was in the ER yesterday with a dizzy spell. It happened at work. He just got dizzy and felt like he was going to pass out but did not. He had had a fleeting sharp pain on Sunday in his chest that lasted for just a second. It has not recurred. He did feel a little clammy with his dizzy spell. He has had more "asthma" recently. Some cough. No PND or orthopnea. No real swelling reported. He says he watches his salt.   Current Outpatient Prescriptions on File Prior to Visit  Medication Sig Dispense Refill  . albuterol (PROVENTIL) (5 MG/ML) 0.5% nebulizer solution Take 2.5 mg by nebulization as needed.        Marland Kitchen aspirin 81 MG tablet Take 81 mg by mouth daily.        . beclomethasone (QVAR) 40 MCG/ACT inhaler Inhale 2 puffs into the lungs 2 (two) times daily.        . budesonide (PULMICORT) 0.25 MG/2ML nebulizer solution Take 0.25 mg by nebulization as needed.        . diltiazem (CARDIZEM CD) 300 MG 24 hr capsule Take 300 mg by mouth daily.        . Fluticasone-Salmeterol (ADVAIR DISKUS) 250-50 MCG/DOSE AEPB Inhale 1 puff into the lungs every 12 (twelve) hours.        . furosemide (LASIX) 40 MG tablet Take 40 mg by mouth daily.        . metoCLOPramide (REGLAN) 5 MG tablet Take 5 mg by mouth daily.        Marland Kitchen olmesartan (BENICAR) 40 MG tablet Take 40 mg by mouth daily.        . roflumilast (DALIRESP) 500 MCG TABS tablet Take 500 mcg by mouth daily.        Marland Kitchen warfarin (COUMADIN) 5 MG tablet Take 5 mg by mouth daily.          Allergies  Allergen Reactions  . Nitroglycerin Other (See Comments)    syncope  . Ace Inhibitors Cough  . Beta Adrenergic Blockers Other (See Comments)    Asthma w/beta blockers    Past Medical History  Diagnosis Date  . Atrial fibrillation     chronic  . CHF (congestive heart  failure)     chronic systolic dysfunction with EF of 35 to 40% in April of 2011  . Diabetes mellitus     type 2  . Mitral regurgitation     moderate per echo in April of 2011  . Asthma   . Chronic anticoagulation     followed by Dr. Eloise Harman  . HTN (hypertension)     Past Surgical History  Procedure Date  . Tonsillectomy   . Inguinal hernia repair     right  . Repair of right index finger   . Surgery for an infected right sacroiliac joint   . Cardiac catheterization     History  Smoking status  . Never Smoker   Smokeless tobacco  . Never Used    History  Alcohol Use No    Family History  Problem Relation Age of Onset  . Heart disease Mother   . Heart disease Father     Review of Systems: The review of systems is positive for recent dizzy  spell. No recurrence. His INR was high yesterday at 4.65. He had just finished prednisone for his asthma.  All other systems were reviewed and are negative.  Physical Exam: BP 105/60  Pulse 96  Ht 6\' 1"  (1.854 m)  Wt 223 lb 3.2 oz (101.243 kg)  BMI 29.45 kg/m2 Blood pressure is 105/60 by me.  Patient is very pleasant and in no acute distress. He is obese. Skin is warm and dry. Color is normal.  HEENT is unremarkable. Normocephalic/atraumatic. PERRL. Sclera are nonicteric. Neck is supple. No masses. No JVD. Lungs are clear. Cardiac exam shows an irregular rhythm. Rate is a little fast. Abdomen is obese but soft. Extremities are without edema. Gait and ROM are intact. No gross neurologic deficits noted.   LABORATORY DATA:   Assessment / Plan:

## 2011-07-19 ENCOUNTER — Ambulatory Visit (HOSPITAL_COMMUNITY): Payer: BC Managed Care – PPO | Attending: Cardiology | Admitting: Radiology

## 2011-07-19 DIAGNOSIS — I059 Rheumatic mitral valve disease, unspecified: Secondary | ICD-10-CM | POA: Insufficient documentation

## 2011-07-19 DIAGNOSIS — I379 Nonrheumatic pulmonary valve disorder, unspecified: Secondary | ICD-10-CM | POA: Insufficient documentation

## 2011-07-19 DIAGNOSIS — E119 Type 2 diabetes mellitus without complications: Secondary | ICD-10-CM | POA: Insufficient documentation

## 2011-07-19 DIAGNOSIS — I4891 Unspecified atrial fibrillation: Secondary | ICD-10-CM | POA: Insufficient documentation

## 2011-07-19 DIAGNOSIS — I079 Rheumatic tricuspid valve disease, unspecified: Secondary | ICD-10-CM | POA: Insufficient documentation

## 2011-07-19 DIAGNOSIS — I10 Essential (primary) hypertension: Secondary | ICD-10-CM | POA: Insufficient documentation

## 2011-07-19 DIAGNOSIS — I509 Heart failure, unspecified: Secondary | ICD-10-CM

## 2011-07-19 LAB — BASIC METABOLIC PANEL
CO2: 22
Chloride: 101
Chloride: 109
GFR calc Af Amer: >60
GFR calc non Af Amer: >60
Glucose, Bld: 142 — ABNORMAL HIGH
Glucose, Bld: 160 — ABNORMAL HIGH
Potassium: 4.4
Sodium: 132 — ABNORMAL LOW
Sodium: 140

## 2011-07-19 LAB — GLUCOSE, CAPILLARY
Glucose-Capillary: 163 — ABNORMAL HIGH
Glucose-Capillary: 182 — ABNORMAL HIGH
Glucose-Capillary: 182 — ABNORMAL HIGH
Glucose-Capillary: 184 — ABNORMAL HIGH

## 2011-07-19 LAB — B-NATRIURETIC PEPTIDE (CONVERTED LAB)
Pro B Natriuretic peptide (BNP): 396 — ABNORMAL HIGH
Pro B Natriuretic peptide (BNP): 677 — ABNORMAL HIGH
Pro B Natriuretic peptide (BNP): 755 — ABNORMAL HIGH

## 2011-07-19 LAB — PROTIME-INR
INR: 2.2 — ABNORMAL HIGH
Prothrombin Time: 26 — ABNORMAL HIGH

## 2011-07-19 LAB — POCT CARDIAC MARKERS: Myoglobin, poc: 103

## 2011-07-19 LAB — CARDIAC PANEL(CRET KIN+CKTOT+MB+TROPI)
CK, MB: 4.1 — ABNORMAL HIGH
Relative Index: 1.3
Total CK: 325 — ABNORMAL HIGH

## 2011-07-19 LAB — COMPREHENSIVE METABOLIC PANEL
ALT: 17
Albumin: 3.9
Alkaline Phosphatase: 59
BUN: 12
Chloride: 105
Glucose, Bld: 182 — ABNORMAL HIGH
Potassium: 3.7
Sodium: 138
Total Bilirubin: 1
Total Protein: 6.7

## 2011-07-19 LAB — CBC
HCT: 42.5
HCT: 44.5
Hemoglobin: 14.1
Hemoglobin: 14.1
Hemoglobin: 15
MCV: 91.6
RBC: 4.67
RDW: 14.5
WBC: 10.5
WBC: 7.3

## 2011-07-19 LAB — DIFFERENTIAL
Basophils Absolute: 0
Basophils Relative: 0
Eosinophils Absolute: 0.4
Monocytes Absolute: 0.3
Monocytes Relative: 3
Neutro Abs: 8.6 — ABNORMAL HIGH
Neutrophils Relative %: 82 — ABNORMAL HIGH

## 2011-07-20 ENCOUNTER — Encounter: Payer: Self-pay | Admitting: *Deleted

## 2011-07-21 ENCOUNTER — Telehealth: Payer: Self-pay | Admitting: *Deleted

## 2011-07-21 NOTE — Telephone Encounter (Signed)
Will discuss results at OV 07/23/11 w/Lori Gerhardt,NP

## 2011-07-21 NOTE — Telephone Encounter (Signed)
Message copied by Lorayne Bender on Wed Jul 21, 2011  4:28 PM ------      Message from: Rosalio Macadamia      Created: Tue Jul 20, 2011  4:55 PM       Please report. Will try some Digoxin when I see him later this week. I will review echo with him on return visit.

## 2011-07-21 NOTE — Telephone Encounter (Signed)
Message copied by Lorayne Bender on Wed Jul 21, 2011  5:40 PM ------      Message from: Rosalio Macadamia      Created: Tue Jul 20, 2011  4:55 PM       Please report. Will try some Digoxin when I see him later this week. I will review echo with him on return visit.

## 2011-07-21 NOTE — Telephone Encounter (Signed)
Will go over echo at OV on 10/5

## 2011-07-23 ENCOUNTER — Ambulatory Visit (INDEPENDENT_AMBULATORY_CARE_PROVIDER_SITE_OTHER): Payer: BC Managed Care – PPO | Admitting: Nurse Practitioner

## 2011-07-23 ENCOUNTER — Encounter: Payer: Self-pay | Admitting: Nurse Practitioner

## 2011-07-23 DIAGNOSIS — I5020 Unspecified systolic (congestive) heart failure: Secondary | ICD-10-CM

## 2011-07-23 DIAGNOSIS — I4891 Unspecified atrial fibrillation: Secondary | ICD-10-CM

## 2011-07-23 MED ORDER — DIGOXIN 125 MCG PO TABS: 125.0000 ug | ORAL_TABLET | Freq: Every day | ORAL | Status: DC

## 2011-07-23 NOTE — Assessment & Plan Note (Signed)
EF is 30 to 35%. He is on ARB and diuretic. Digoxin is added. He may eventually require ICD/pacemaker. We did briefly discuss this today. He has no current CHF symptoms. Could consider aldactone in the future as well. I will have him see Dr. Swaziland in a month for further review.

## 2011-07-23 NOTE — Progress Notes (Signed)
[name] Date of Birth: 29-Nov-1945   History of Present Illness: [name] is seen back today for a follow up visit. He is seen for [name]. He had had a dizzy spell and gone to the ER about 2 weeks ago. We have placed a holter which showed NSVT v/s abberancy.  Echo was updated. EF remains in the 30 to 35% range. He does not take beta blockers because of asthma. He is on ARB and diuretic therapy. His rate has been controlled with Cardizem.  He has had no further spells. Feels good. No CHF symptoms. No swelling. Weights have been stable at home. No chest pain.   Current Outpatient Prescriptions on File Prior to Visit  Medication Sig Dispense Refill  . albuterol (PROVENTIL) (5 MG/ML) 0.5% nebulizer solution Take 2.5 mg by nebulization as needed.        Marland Kitchen aspirin 81 MG tablet Take 81 mg by mouth daily.        . beclomethasone (QVAR) 40 MCG/ACT inhaler Inhale 2 puffs into the lungs 2 (two) times daily.        . budesonide (PULMICORT) 0.25 MG/2ML nebulizer solution Take 0.25 mg by nebulization as needed.        . diltiazem (CARDIZEM CD) 300 MG 24 hr capsule Take 300 mg by mouth daily.        . Fluticasone-Salmeterol (ADVAIR DISKUS) 250-50 MCG/DOSE AEPB Inhale 1 puff into the lungs every 12 (twelve) hours.        . furosemide (LASIX) 40 MG tablet Take 40 mg by mouth daily.        . metoCLOPramide (REGLAN) 5 MG tablet Take 5 mg by mouth daily.        Marland Kitchen olmesartan (BENICAR) 40 MG tablet Take 40 mg by mouth daily.        . roflumilast (DALIRESP) 500 MCG TABS tablet Take 500 mcg by mouth daily.        Marland Kitchen warfarin (COUMADIN) 5 MG tablet Take 5 mg by mouth daily.          Allergies  Allergen Reactions  . Nitroglycerin Other (See Comments)    syncope  . Ace Inhibitors Cough  . Beta Adrenergic Blockers Other (See Comments)    Asthma w/beta blockers    Past Medical History  Diagnosis Date  . Atrial fibrillation     chronic  . CHF (congestive heart failure)     chronic systolic  dysfunction with EF of 35 to 40% in April of 2011  . Diabetes mellitus     type 2  . Mitral regurgitation     moderate per echo in April of 2011  . Asthma   . Chronic anticoagulation     followed by [name]  . HTN (hypertension)   . History of cardiac cath 2004    Normal coronaries with EF of 20% and 2+ mitral insufficiency  . 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 Tonsillectomy   . Inguinal hernia repair     right  . Repair of right index finger   . Surgery for an infected right sacroiliac joint   . Cardiac catheterization   . US echocardiography Sept 2012`    EF 30 to 35%    History  Smoking status  . Never Smoker   Smokeless tobacco  . Never Used    History  Alcohol Use No  Family History  Problem Relation Age of Onset  . Heart disease Mother   . Heart disease Father     Review of Systems: The review of systems is per the HPI. He does have some chronic fatigue. No further dizzy spells.  All other systems were reviewed and are negative.  Physical Exam: BP 112/68  Pulse 54  Ht 6\' 1"  (1.854 m)  Wt 227 lb (102.967 kg)  BMI 29.95 kg/m2 Patient is very pleasant and in no acute distress. He is obese. Skin is warm and dry. Color is normal.  HEENT is unremarkable. Normocephalic/atraumatic. PERRL. Sclera are nonicteric. Neck is supple. No masses. No JVD. Lungs are clear. Cardiac exam shows an irregular rhythm. His rate is currently controlled today. Abdomen is obese but soft. Extremities are without edema. Gait and ROM are intact. No gross neurologic deficits noted.  LABORATORY DATA: Holter with tachy brady and NSVT. Max rate up to 220. His bradycardia was mostly at night. He was not felt to have adequate rate control. Echo with EF of 30 to 35%.    Assessment / Plan:

## 2011-07-23 NOTE — Patient Instructions (Signed)
We are going to add Digoxin (lanoxin) at 0.125 mg daily Continue with your other medicines We will see you in one month.  Monitor your heart rate at home. Call for any problems. Let us know if you have any dizzy or lightheaded spells.

## 2011-07-23 NOTE — Assessment & Plan Note (Signed)
Holter shows NSVT vs abberancy. I had already talked with Dr. [place] regarding his care. We will try to add Digoxin at 0.125mg  daily. He is to be on the lookout for further dizziness or presyncope. He can monitor his heart rate at home. We will see him back in a month.

## 2011-07-23 NOTE — Assessment & Plan Note (Signed)
He does not take beta blockers due to this history.

## 2011-08-31 ENCOUNTER — Telehealth: Payer: Self-pay | Admitting: Nurse Practitioner

## 2011-08-31 NOTE — Telephone Encounter (Addendum)
ROI Emailed to Pt 08/31/11/km  Pt Faxed me ROI back, All Cardiac records were faxed to Dr[facility] @ [phone]  09/01/11/km

## 2011-09-06 ENCOUNTER — Ambulatory Visit: Payer: BC Managed Care – PPO | Admitting: Cardiology

## 2011-09-08 ENCOUNTER — Encounter (HOSPITAL_COMMUNITY): Payer: Self-pay | Admitting: Pharmacy Technician

## 2011-09-14 ENCOUNTER — Encounter (HOSPITAL_COMMUNITY)
Admission: RE | Disposition: A | Discharge status: Home / Self Care | Payer: Self-pay | Source: Ambulatory Visit | Attending: Cardiology

## 2011-09-14 ENCOUNTER — Other Ambulatory Visit: Payer: Self-pay | Admitting: Cardiology

## 2011-09-14 ENCOUNTER — Ambulatory Visit (HOSPITAL_COMMUNITY)
Admission: RE | Admit: 2011-09-14 | Discharge: 2011-09-14 | Disposition: A | Discharge status: Home / Self Care | Payer: BC Managed Care – PPO | Source: Ambulatory Visit | Attending: Cardiology | Admitting: Cardiology

## 2011-09-14 ENCOUNTER — Encounter (HOSPITAL_COMMUNITY): Payer: Self-pay | Admitting: Cardiology

## 2011-09-14 DIAGNOSIS — R42 Dizziness and giddiness: Secondary | ICD-10-CM | POA: Insufficient documentation

## 2011-09-14 DIAGNOSIS — I428 Other cardiomyopathies: Secondary | ICD-10-CM | POA: Insufficient documentation

## 2011-09-14 DIAGNOSIS — E785 Hyperlipidemia, unspecified: Secondary | ICD-10-CM | POA: Insufficient documentation

## 2011-09-14 DIAGNOSIS — R0789 Other chest pain: Secondary | ICD-10-CM | POA: Insufficient documentation

## 2011-09-14 DIAGNOSIS — I10 Essential (primary) hypertension: Secondary | ICD-10-CM | POA: Insufficient documentation

## 2011-09-14 DIAGNOSIS — I519 Heart disease, unspecified: Secondary | ICD-10-CM | POA: Insufficient documentation

## 2011-09-14 HISTORY — PX: LEFT AND RIGHT HEART CATHETERIZATION WITH CORONARY ANGIOGRAM: SHX5449

## 2011-09-14 LAB — POCT I-STAT 3, ART BLOOD GAS (G3+)
Acid-base deficit: 5 mmol/L — ABNORMAL HIGH (ref 0.0–2.0)
Bicarbonate: 20.5 mEq/L (ref 20.0–24.0)
O2 Saturation: 94 %

## 2011-09-14 LAB — POCT I-STAT 3, VENOUS BLOOD GAS (G3P V)
Bicarbonate: 21.3 mEq/L (ref 20.0–24.0)
O2 Saturation: 57 %
TCO2: 23 mmol/L (ref 0–100)
pCO2, Ven: 40.9 mmHg — ABNORMAL LOW (ref 45.0–50.0)

## 2011-09-14 LAB — BASIC METABOLIC PANEL
Calcium: 8.7 mg/dL (ref 8.4–10.5)
Creatinine, Ser: 1.23 mg/dL (ref 0.50–1.35)
GFR calc Af Amer: 70 mL/min — ABNORMAL LOW (ref >90)
GFR calc non Af Amer: 60 mL/min — ABNORMAL LOW (ref >90)

## 2011-09-14 LAB — PROTIME-INR: Prothrombin Time: 18.5 seconds — ABNORMAL HIGH (ref 11.6–15.2)

## 2011-09-14 SURGERY — LEFT AND RIGHT HEART CATHETERIZATION WITH CORONARY ANGIOGRAM
Anesthesia: LOCAL

## 2011-09-14 MED ORDER — SODIUM CHLORIDE 0.9 % IV SOLN: 1.0000 mL/kg/h | INTRAVENOUS | Status: DC

## 2011-09-14 MED ORDER — VERAPAMIL HCL 2.5 MG/ML IV SOLN
INTRAVENOUS | Status: AC
  Filled 2011-09-14: qty 2

## 2011-09-14 MED ORDER — ONDANSETRON HCL 4 MG/2ML IJ SOLN: 4.0000 mg | Freq: Four times a day (QID) | INTRAMUSCULAR | Status: DC | PRN

## 2011-09-14 MED ORDER — ASPIRIN 81 MG PO CHEW
324.0000 mg | CHEWABLE_TABLET | ORAL | Status: DC
  Filled 2011-09-14: qty 4

## 2011-09-14 MED ORDER — LIDOCAINE HCL (PF) 1 % IJ SOLN
INTRAMUSCULAR | Status: AC
  Filled 2011-09-14: qty 30

## 2011-09-14 MED ORDER — MIDAZOLAM HCL 2 MG/2ML IJ SOLN
INTRAMUSCULAR | Status: AC
  Filled 2011-09-14: qty 2

## 2011-09-14 MED ORDER — ASPIRIN 81 MG PO CHEW
324.0000 mg | CHEWABLE_TABLET | ORAL | Status: AC
  Administered 2011-09-14: 324 mg via ORAL

## 2011-09-14 MED ORDER — HEPARIN (PORCINE) IN NACL 2-0.9 UNIT/ML-% IJ SOLN
INTRAMUSCULAR | Status: AC
  Filled 2011-09-14: qty 2000

## 2011-09-14 MED ORDER — SODIUM CHLORIDE 0.9 % IV SOLN
INTRAVENOUS | Status: DC
  Administered 2011-09-14: 07:00:00 via INTRAVENOUS

## 2011-09-14 MED ORDER — ACETAMINOPHEN 325 MG PO TABS: 650.0000 mg | ORAL_TABLET | ORAL | Status: DC | PRN

## 2011-09-14 MED ORDER — HYDROMORPHONE HCL PF 2 MG/ML IJ SOLN
INTRAMUSCULAR | Status: AC
  Filled 2011-09-14: qty 1

## 2011-09-14 MED ORDER — NITROGLYCERIN 0.2 MG/ML ON CALL CATH LAB
INTRAVENOUS | Status: AC
  Filled 2011-09-14: qty 1

## 2011-09-14 MED ORDER — HEPARIN SODIUM (PORCINE) 1000 UNIT/ML IJ SOLN
INTRAMUSCULAR | Status: AC
  Filled 2011-09-14: qty 1

## 2011-09-14 MED ORDER — SODIUM CHLORIDE 0.9 % IJ SOLN: 3.0000 mL | Freq: Two times a day (BID) | INTRAMUSCULAR | Status: DC

## 2011-09-14 NOTE — Brief Op Note (Signed)
09/14/2011  9:51 AM Dictation : [id number]  PATIENT:  [name]  65 y.o. male  PRE-OPERATIVE DIAGNOSIS:  Cardiomyopathy and syncope.  POST-OPERATIVE DIAGNOSIS:  * No post-op diagnosis entered *  PROCEDURE:  Procedure(s): LEFT AND RIGHT HEART CATHETERIZATION WITH CORONARY ANGIOGRAM Mild CAD of Proximal RCA. EF 25% Normal right heart pressure. Mild decrease in cardiac index.  SURGEON:  Surgeon(s): Pamella Pert

## 2011-09-14 NOTE — Cardiovascular Report (Signed)
NAMECARMELO, REIDEL NO.:  [id number]  MEDICAL RECORD NO.:  [id number]  LOCATION:  [facility]                         FACILITY:  [facility]  PHYSICIAN:  Pamella Pert, MD DATE OF BIRTH:  1946/03/11  DATE OF PROCEDURE: DATE OF DISCHARGE:                           CARDIAC CATHETERIZATION   PROCEDURES PERFORMED: 1. Selective right and left coronary arteriography and left     ventriculography. 2. Right heart catheterization and calculation of cardiac output and     cardiac index by Fick.  INDICATION:  Mr. Leonardo Makris is a pleasant 65 year old gentleman with history of hypertension, hyperlipidemia who has known nonischemic cardiomyopathy.  Recently, he has had atypical chest pain about 3-4 weeks ago and also had dizziness.  He presented to the emergency room with this, he was ruled out for myocardial infarction, and was discharged home.  He also had an outpatient Holter monitoring which had revealed wide-complex tachycardia, nonsustained.  Also a week later, he had an episode of syncope, which was unexplained, with an ejection fraction of 30-35% by recent echocardiogram which had decreased from 40- 45%.  Previously, we felt that cardiac progression of coronary disease and reversible ischemia for his syncope and nonsustained ventricular tachycardia was indicated and he was brought to the cardiac cath lab to evaluate his coronary anatomy.  Right heart catheterization is being performed to evaluate for pulmonary hypertension and to evaluate his cardiac output and cardiac index.  HEMODYNAMIC DATA:  Left heart catheterization.  Left ventricular pressure was 75/2 with end-diastolic pressure of 5 mmHg.  Aortic pressure was 64/43 with a mean of 53 mmHg.  There was no pressure gradient across the aortic valve.  ANGIOGRAPHIC DATA:  Left ventricle.  Left ventricle systolic function was markedly reduced with ejection fraction of about 25% with severe global hypokinesis.   There was no significant mitral regurgitation.  Right coronary artery.  Right coronary artery is a large-caliber vessel and a dominant vessel.  Slow filling is evident in the right coronary artery.  There is mild ectasia noted in the proximal to mid segment with mild luminal irregularity.  It gives origin to large PDA and large PL branch.  The PDA and PL branches are smooth and normal.  Left main coronary artery.  Left main coronary artery is a large vessel. It is smooth and normal.  LAD.  LAD is a large-caliber vessel giving origin to large diagonal 1 and several small diagonals.  It is smooth and normal.  Slow filling was evident in the left anterior descending artery.  Circumflex coronary artery.  Circumflex coronary artery is a large- caliber vessel.  Gives origin to a large obtuse marginal 1 and continues in the AV groove.  It is smooth and normal.  Ramus intermediate.  Ramus intermediate is a moderate-caliber vessel (high obtuse marginal 1).  It is smooth and normal.  HEMODYNAMIC DATA:  Right heart catheterization.  RA pressure was 9/0 with a mean of 3 mmHg.  Right ventricular pressure was 21/2 with end-diastolic pressure of 6 mmHg.  PA pressure was 21/12 with a mean of 16 mmHg.  PA saturation 57%.  Pulmonary capillary wedge pressure was 11/8 with a mean of 12 mmHg. Aortic saturation  was 94%. Cardiac output by Fick was 4.36 with a cardiac index of 1.93.  IMPRESSION: 1. Nonischemic cardiomyopathy with severe left ventricular systolic     dysfunction, ejection fraction 25%. 2. Slow filling evident into the coronary artery suggestive of     microvascular disease and elevated end-diastolic pressure, however,     left ventricular end-diastolic pressure was low and systemic     pressures were low during cardiac catheterization due to low volume     status and sedation. 3. Mildly reduced cardiac index, preserved cardiac output.  RECOMMENDATION:  The patient will be  discharged home today with outpatient evaluation by Dr. Sherryl Manges for evaluation of ICD implantation for primary prevention of sudden cardiac death.  The patient's condition has already been discussed with him.  TECHNIQUE OF THE PROCEDURE:  Under sterile precautions, using a 5-French right antecubital vein access, I initially attempted to perform right heart catheterization through the antecubital fossa.  However, because of the stenosis in the right brachiocephalic vein, we abandoned the procedure after performing a venogram and proceeded with obtaining right femoral venous access and complete right heart catheterization using right femoral arterial access with a balloon tipped Swan-Ganz catheter. Hemodynamics were obtained and cardiac output and cardiac index were calculated by Fick.  The catheter was then pulled out of body in usual fashion.  Left heart catheterization was performed using a 6-French right radial arterial access site.  A 5-French TIG #4 catheter was advanced to the ascending aorta and selective left and right coronary arteriography was performed.  Catheter exchange over an exchange length J-wire to a 5- French pigtail catheter and left ventriculography was performed in the RAO projection.  The catheter exchange was again done over a J-wire. Hemostasis at the right radial site was obtained by applying TR band and manual pressure was held at the right antecubital vein and right femoral vein access site.  The patient tolerated the procedure.  No immediate complication noted.     Pamella Pert, MD     JRG/MEDQ  D:  09/14/2011  T:  09/14/2011  Job:  [id number]  cc:   Barry Dienes. Eloise Harman, M.D. Duke Salvia, MD, [facility]

## 2011-09-14 NOTE — Progress Notes (Signed)
Up and walked and tol well 

## 2011-09-15 ENCOUNTER — Telehealth: Payer: Self-pay | Admitting: Cardiology

## 2011-09-15 NOTE — Telephone Encounter (Signed)
Called pt, he is no longer a Dr [place] pt, he is seen by Dr Jacinto Halim. Pt needs a referral/order to EP. Pt informed this is to be handled by his office. Pt verbalized understanding.

## 2011-09-15 NOTE — Telephone Encounter (Signed)
New Msg: Pt calling regarding setting up an appointment for one of our MD's to implant a defibrillator. Please return pt call to discuss further.

## 2011-09-24 ENCOUNTER — Encounter: Payer: Self-pay | Admitting: *Deleted

## 2011-09-24 ENCOUNTER — Ambulatory Visit (INDEPENDENT_AMBULATORY_CARE_PROVIDER_SITE_OTHER): Payer: BC Managed Care – PPO | Admitting: Internal Medicine

## 2011-09-24 ENCOUNTER — Encounter: Payer: Self-pay | Admitting: Internal Medicine

## 2011-09-24 ENCOUNTER — Other Ambulatory Visit: Payer: Self-pay | Admitting: Internal Medicine

## 2011-09-24 VITALS — BP 112/64 | HR 77 | Ht 73.0 in | Wt 220.0 lb

## 2011-09-24 DIAGNOSIS — I5020 Unspecified systolic (congestive) heart failure: Secondary | ICD-10-CM

## 2011-09-24 DIAGNOSIS — I4891 Unspecified atrial fibrillation: Secondary | ICD-10-CM

## 2011-09-24 DIAGNOSIS — R55 Syncope and collapse: Secondary | ICD-10-CM

## 2011-09-24 DIAGNOSIS — I428 Other cardiomyopathies: Secondary | ICD-10-CM | POA: Insufficient documentation

## 2011-09-24 DIAGNOSIS — I509 Heart failure, unspecified: Secondary | ICD-10-CM

## 2011-09-24 NOTE — Assessment & Plan Note (Signed)
As above.

## 2011-09-24 NOTE — Progress Notes (Signed)
HPI  [name] is a 65 y.o. male Seen at the request of Dr Ganji because of syncope.  He has a known nonischemic cardiomyopathy of long-standing with a recent catheterization last week confirming the absence of coronary disease who has chronic class 2-3 congestive failure. He also has permanent atrial fibrillation for which he takes aspirin and warfarin.  A few weeks ago he was standing in the kitchen fixing dinner for his son he turned and collapsed to the ground. He was out just for a few moments and was able to stand and continue his activities. He has a episode of remote /syncope a few years ago where he passed out with some warning  He has some nocturnal dyspnea that responds to the dilators. He is followed for asthma. He has some occasional peripheral edema. Unable to climb a flight of stairs without dyspnea. He denies chest pain. He has had no palpitations.  He snores. His daytime somnolence..  .  Past Medical History  Diagnosis Date  . Atrial fibrillation     chronic  . CHF (congestive heart failure)     chronic systolic dysfunction with EF of 35 to 40% in April of 2011  . Diabetes mellitus     type 2  . Mitral regurgitation     moderate per echo in April of 2011  . Asthma   . Chronic anticoagulation     followed by Dr. Paterson  . HTN (hypertension)   . History of cardiac cath 2004    Normal coronaries with EF of 20% and 2+ mitral insufficiency  . 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 Tonsillectomy   . Inguinal hernia repair     right  . Repair of right index finger   . Surgery for an infected right sacroiliac joint   . Cardiac catheterization   . Us echocardiography Sept 2012`    EF 30 to 35%    Current Outpatient Prescriptions  Medication Sig Dispense Refill  . albuterol (PROVENTIL) (5 MG/ML) 0.5% nebulizer solution Take 2.5 mg by nebulization as needed.        . aspirin 81 MG tablet Take  81 mg by mouth daily.        . digoxin (LANOXIN) 0.125 MG tablet Take 1 tablet (125 mcg total) by mouth daily.  30 tablet  11  . Fluticasone-Salmeterol (ADVAIR DISKUS) 250-50 MCG/DOSE AEPB Inhale 1 puff into the lungs every 12 (twelve) hours.        . furosemide (LASIX) 40 MG tablet Take 40 mg by mouth daily.        . metoCLOPramide (REGLAN) 5 MG tablet Take 5 mg by mouth daily.        . nebivolol (BYSTOLIC) 5 MG tablet Take 5 mg by mouth daily.        . olmesartan (BENICAR) 40 MG tablet Take 40 mg by mouth as directed. TAKES MON, WED, FRI AND SUN      . roflumilast (DALIRESP) 500 MCG TABS tablet Take 500 mcg by mouth daily.        . warfarin (COUMADIN) 10 MG tablet Take 10 mg by mouth as directed. WED AND FRIDAYS...7.5MG ALL OTHER DAYS       . warfarin (COUMADIN) 7.5 MG tablet Take 7.5 mg by mouth as directed. 7.5 MG MON, TUES, THURS, SAT AND SUN...10MG WED AND FRI         Allergies    Allergen Reactions  . Nitroglycerin Other (See Comments)    syncope  . Ace Inhibitors Cough  . Beta Adrenergic Blockers Other (See Comments)    Asthma w/beta blockers    Review of Systems negative except from HPI and PMH  Physical Exam Well developed and well nourished obese older white male wearing a life vest in no acute distress HENT normal E scleral and icterus clear Neck Supple JVP 9-10cm; carotids brisk and full Clear to ausculation Regular rate and rhythm, 2/6 murmurs no gallups Soft with active bowel sounds No clubbing cyanosis Trace Edema Alert and oriented, grossly normal motor and sensory function Skin Warm and Dry LN neg Affect appropriate  Assessment and  Plan  

## 2011-09-24 NOTE — Patient Instructions (Signed)

## 2011-09-24 NOTE — Assessment & Plan Note (Signed)
He is on appropriate guidelines directed therapies; it might be appropriate to consider spironolactone.

## 2011-09-24 NOTE — Assessment & Plan Note (Signed)
We reviewed the Holter monitor. He has permanent atrial fibrillation with a mean heart rate of 110 excursions to the 160s 70 range. Control of his heart rate is essential as relates to his current myopathy. The plan will be to undertake a CRT implantation with anticipated AV junction ablation.

## 2011-09-24 NOTE — Assessment & Plan Note (Signed)
The patient has syncope in the context of significant nonischemic cardiomyopathy with an ejection fraction of 30-35%. This is somewhat elevated because of significant mitral regurgitation. It is appropriate that he undergo ICD implantation for prevention of sudden cardiac death was secondary prevention point of view. We reviewed the potential benefits and risks including but not limited to perforation lead dislodgment and device malfunction as well as inappropriate shocks. He understands this risk and is willing to proceed

## 2011-09-30 ENCOUNTER — Other Ambulatory Visit (INDEPENDENT_AMBULATORY_CARE_PROVIDER_SITE_OTHER): Payer: BC Managed Care – PPO | Admitting: *Deleted

## 2011-09-30 DIAGNOSIS — I509 Heart failure, unspecified: Secondary | ICD-10-CM

## 2011-10-01 LAB — CBC WITH DIFFERENTIAL/PLATELET
Basophils Absolute: 0 10*3/uL (ref 0.0–0.1)
Eosinophils Relative: 2.6 % (ref 0.0–5.0)
Lymphocytes Relative: 19.7 % (ref 12.0–46.0)
Monocytes Relative: 8.5 % (ref 3.0–12.0)
Neutrophils Relative %: 68.8 % (ref 43.0–77.0)
Platelets: 169 10*3/uL (ref 150.0–400.0)
RDW: 13.4 % (ref 11.5–14.6)
WBC: 6.3 10*3/uL (ref 4.5–10.5)

## 2011-10-01 LAB — BASIC METABOLIC PANEL
BUN: 23 mg/dL (ref 6–23)
Calcium: 8.9 mg/dL (ref 8.4–10.5)
GFR: 78.98 mL/min (ref >60.00)
Glucose, Bld: 100 mg/dL — ABNORMAL HIGH (ref 70–99)
Potassium: 4 mEq/L (ref 3.5–5.1)

## 2011-10-01 LAB — PROTIME-INR: Prothrombin Time: 54.1 s (ref 10.2–12.4)

## 2011-10-01 NOTE — Telephone Encounter (Signed)
I spoke with the patient regarding his elevated INR. He will hold coumadin starting today, per Dr. Graciela Husbands, in preparation for his ICD implant on 10/05/11.

## 2011-10-01 NOTE — Telephone Encounter (Signed)
Follow up on previous call:  Returning called from nurse, Herbert Seta

## 2011-10-04 MED ORDER — CEFAZOLIN SODIUM-DEXTROSE 2-3 GM-% IV SOLR
2.0000 g | INTRAVENOUS | Status: DC
  Administered 2011-10-05: 2 g via INTRAVENOUS
  Filled 2011-10-04: qty 50

## 2011-10-04 MED ORDER — SODIUM CHLORIDE 0.9 % IR SOLN
80.0000 mg | Status: AC
  Administered 2011-10-05: 80 mg
  Filled 2011-10-04 (×2): qty 2

## 2011-10-04 MED ORDER — SODIUM CHLORIDE 0.9 % IV SOLN
INTRAVENOUS | Status: DC
  Administered 2011-10-05: 12:00:00 via INTRAVENOUS

## 2011-10-04 MED ORDER — CHLORHEXIDINE GLUCONATE 4 % EX LIQD
60.0000 mL | Freq: Once | CUTANEOUS | Status: DC
  Filled 2011-10-04: qty 60

## 2011-10-04 MED ORDER — SODIUM CHLORIDE 0.45 % IV SOLN: INTRAVENOUS | Status: DC

## 2011-10-05 ENCOUNTER — Ambulatory Visit (HOSPITAL_COMMUNITY)
Admission: RE | Admit: 2011-10-05 | Discharge: 2011-10-06 | Disposition: A | Discharge status: Home / Self Care | Payer: BC Managed Care – PPO | Source: Ambulatory Visit | Attending: Internal Medicine | Admitting: Internal Medicine

## 2011-10-05 ENCOUNTER — Encounter (HOSPITAL_COMMUNITY): Payer: Self-pay | Admitting: General Practice

## 2011-10-05 ENCOUNTER — Encounter (HOSPITAL_COMMUNITY)
Admission: RE | Disposition: A | Discharge status: Home / Self Care | Payer: Self-pay | Source: Ambulatory Visit | Attending: Internal Medicine

## 2011-10-05 DIAGNOSIS — I428 Other cardiomyopathies: Secondary | ICD-10-CM

## 2011-10-05 DIAGNOSIS — I059 Rheumatic mitral valve disease, unspecified: Secondary | ICD-10-CM | POA: Insufficient documentation

## 2011-10-05 DIAGNOSIS — R55 Syncope and collapse: Secondary | ICD-10-CM | POA: Insufficient documentation

## 2011-10-05 DIAGNOSIS — J45909 Unspecified asthma, uncomplicated: Secondary | ICD-10-CM | POA: Insufficient documentation

## 2011-10-05 DIAGNOSIS — I509 Heart failure, unspecified: Secondary | ICD-10-CM | POA: Insufficient documentation

## 2011-10-05 DIAGNOSIS — E119 Type 2 diabetes mellitus without complications: Secondary | ICD-10-CM | POA: Insufficient documentation

## 2011-10-05 DIAGNOSIS — I4891 Unspecified atrial fibrillation: Secondary | ICD-10-CM | POA: Insufficient documentation

## 2011-10-05 DIAGNOSIS — I10 Essential (primary) hypertension: Secondary | ICD-10-CM | POA: Insufficient documentation

## 2011-10-05 DIAGNOSIS — Z7901 Long term (current) use of anticoagulants: Secondary | ICD-10-CM | POA: Insufficient documentation

## 2011-10-05 DIAGNOSIS — I4821 Permanent atrial fibrillation: Secondary | ICD-10-CM | POA: Diagnosis present

## 2011-10-05 DIAGNOSIS — I5020 Unspecified systolic (congestive) heart failure: Secondary | ICD-10-CM | POA: Diagnosis present

## 2011-10-05 HISTORY — DX: Acute myocardial infarction, unspecified: I21.9

## 2011-10-05 HISTORY — PX: CARDIAC DEFIBRILLATOR PLACEMENT: SHX171

## 2011-10-05 HISTORY — DX: Unspecified osteoarthritis, unspecified site: M19.90

## 2011-10-05 HISTORY — PX: BI-VENTRICULAR IMPLANTABLE CARDIOVERTER DEFIBRILLATOR: SHX5459

## 2011-10-05 HISTORY — PX: INSERT / REPLACE / REMOVE PACEMAKER: SUR710

## 2011-10-05 LAB — SURGICAL PCR SCREEN
MRSA, PCR: NEGATIVE
Staphylococcus aureus: NEGATIVE

## 2011-10-05 LAB — PROTIME-INR
INR: 1.46 (ref 0.00–1.49)
Prothrombin Time: 18 seconds — ABNORMAL HIGH (ref 11.6–15.2)

## 2011-10-05 SURGERY — BI-VENTRICULAR IMPLANTABLE CARDIOVERTER DEFIBRILLATOR  (CRT-D)
Anesthesia: LOCAL | Laterality: Left

## 2011-10-05 MED ORDER — FENTANYL CITRATE 0.05 MG/ML IJ SOLN
INTRAMUSCULAR | Status: AC
  Filled 2011-10-05: qty 2

## 2011-10-05 MED ORDER — FLUTICASONE-SALMETEROL 250-50 MCG/DOSE IN AEPB
1.0000 | INHALATION_SPRAY | Freq: Two times a day (BID) | RESPIRATORY_TRACT | Status: DC
  Administered 2011-10-05 – 2011-10-06 (×2): 1 via RESPIRATORY_TRACT
  Filled 2011-10-05: qty 14

## 2011-10-05 MED ORDER — WARFARIN SODIUM 7.5 MG PO TABS: 7.5000 mg | ORAL_TABLET | ORAL | Status: DC

## 2011-10-05 MED ORDER — WARFARIN SODIUM 10 MG PO TABS: 10.0000 mg | ORAL_TABLET | ORAL | Status: DC

## 2011-10-05 MED ORDER — FUROSEMIDE 40 MG PO TABS
40.0000 mg | ORAL_TABLET | Freq: Every day | ORAL | Status: DC
  Administered 2011-10-06: 40 mg via ORAL
  Filled 2011-10-05 (×2): qty 1

## 2011-10-05 MED ORDER — LIDOCAINE HCL (PF) 1 % IJ SOLN
INTRAMUSCULAR | Status: AC
  Filled 2011-10-05: qty 30

## 2011-10-05 MED ORDER — DIGOXIN 125 MCG PO TABS
125.0000 ug | ORAL_TABLET | Freq: Every day | ORAL | Status: DC
  Administered 2011-10-06: 125 ug via ORAL
  Filled 2011-10-05 (×2): qty 1

## 2011-10-05 MED ORDER — SODIUM CHLORIDE 0.9 % IV SOLN: 250.0000 mL | INTRAVENOUS | Status: DC | PRN

## 2011-10-05 MED ORDER — ROFLUMILAST 500 MCG PO TABS
500.0000 ug | ORAL_TABLET | Freq: Every day | ORAL | Status: DC
  Administered 2011-10-06: 500 ug via ORAL
  Filled 2011-10-05 (×2): qty 1

## 2011-10-05 MED ORDER — MUPIROCIN 2 % EX OINT
TOPICAL_OINTMENT | Freq: Two times a day (BID) | CUTANEOUS | Status: DC
Start: 2011-10-05 — End: 2011-10-05
  Administered 2011-10-05: 1 via NASAL
  Filled 2011-10-05: qty 22

## 2011-10-05 MED ORDER — ONDANSETRON HCL 4 MG/2ML IJ SOLN: 4.0000 mg | Freq: Four times a day (QID) | INTRAMUSCULAR | Status: DC | PRN

## 2011-10-05 MED ORDER — NEBIVOLOL HCL 5 MG PO TABS
5.0000 mg | ORAL_TABLET | Freq: Every day | ORAL | Status: DC
  Administered 2011-10-06: 5 mg via ORAL
  Filled 2011-10-05 (×2): qty 1

## 2011-10-05 MED ORDER — WARFARIN SODIUM 10 MG PO TABS
10.0000 mg | ORAL_TABLET | Freq: Once | ORAL | Status: AC
  Administered 2011-10-05: 10 mg via ORAL
  Filled 2011-10-05: qty 1

## 2011-10-05 MED ORDER — CEFAZOLIN SODIUM 1-5 GM-% IV SOLN
1.0000 g | Freq: Four times a day (QID) | INTRAVENOUS | Status: AC
  Administered 2011-10-05 – 2011-10-06 (×3): 1 g via INTRAVENOUS
  Filled 2011-10-05 (×3): qty 50

## 2011-10-05 MED ORDER — CEFAZOLIN SODIUM-DEXTROSE 2-3 GM-% IV SOLR
INTRAVENOUS | Status: AC
  Filled 2011-10-05: qty 50

## 2011-10-05 MED ORDER — METOCLOPRAMIDE HCL 5 MG PO TABS
5.0000 mg | ORAL_TABLET | Freq: Every day | ORAL | Status: DC
  Administered 2011-10-06: 5 mg via ORAL
  Filled 2011-10-05 (×2): qty 1

## 2011-10-05 MED ORDER — SODIUM CHLORIDE 0.9 % IJ SOLN
3.0000 mL | Freq: Two times a day (BID) | INTRAMUSCULAR | Status: DC
  Administered 2011-10-05: 3 mL via INTRAVENOUS

## 2011-10-05 MED ORDER — LIDOCAINE HCL (PF) 1 % IJ SOLN
INTRAMUSCULAR | Status: AC
  Filled 2011-10-05: qty 60

## 2011-10-05 MED ORDER — MUPIROCIN 2 % EX OINT
TOPICAL_OINTMENT | CUTANEOUS | Status: AC
  Filled 2011-10-05: qty 22

## 2011-10-05 MED ORDER — HEPARIN (PORCINE) IN NACL 2-0.9 UNIT/ML-% IJ SOLN
INTRAMUSCULAR | Status: AC
  Filled 2011-10-05: qty 1000

## 2011-10-05 MED ORDER — MIDAZOLAM HCL 5 MG/5ML IJ SOLN
INTRAMUSCULAR | Status: AC
  Filled 2011-10-05: qty 5

## 2011-10-05 MED ORDER — CEFAZOLIN SODIUM 1-5 GM-% IV SOLN
1.0000 g | Freq: Four times a day (QID) | INTRAVENOUS | Status: DC
  Filled 2011-10-05 (×3): qty 50

## 2011-10-05 MED ORDER — SODIUM CHLORIDE 0.9 % IJ SOLN: 3.0000 mL | INTRAMUSCULAR | Status: DC | PRN

## 2011-10-05 MED ORDER — OLMESARTAN MEDOXOMIL 40 MG PO TABS: 40.0000 mg | ORAL_TABLET | ORAL | Status: DC

## 2011-10-05 MED ORDER — ALBUTEROL SULFATE (5 MG/ML) 0.5% IN NEBU: 2.5000 mg | INHALATION_SOLUTION | RESPIRATORY_TRACT | Status: DC | PRN

## 2011-10-05 MED ORDER — ACETAMINOPHEN 325 MG PO TABS
650.0000 mg | ORAL_TABLET | Freq: Four times a day (QID) | ORAL | Status: AC
  Administered 2011-10-05 – 2011-10-06 (×4): 650 mg via ORAL
  Filled 2011-10-05 (×4): qty 2

## 2011-10-05 MED ORDER — OLMESARTAN MEDOXOMIL 40 MG PO TABS
40.0000 mg | ORAL_TABLET | ORAL | Status: DC
  Filled 2011-10-05: qty 1

## 2011-10-05 MED ORDER — ACETAMINOPHEN 325 MG PO TABS: 325.0000 mg | ORAL_TABLET | ORAL | Status: DC | PRN

## 2011-10-05 NOTE — Progress Notes (Signed)
ANTICOAGULATION CONSULT NOTE - Initial Consult  Pharmacy Consult for Coumadin Indication: atrial fibrillation  Allergies  Allergen Reactions  . Nitroglycerin Other (See Comments)    syncope  . Ace Inhibitors Cough  . Beta Adrenergic Blockers Other (See Comments)    Asthma w/beta blockers    Patient Measurements: Height: 6\' 1"  (185.4 cm) Weight: 216 lb (97.977 kg) IBW/kg (Calculated) : 79.9  Adjusted Body Weight:    Vital Signs: Temp: 96.6 F (35.9 C) (12/18 1105) BP: 113/69 mmHg (12/18 1105) Pulse Rate: 74  (12/18 1105)  Labs:  Basename 10/05/11 1122  HGB --  HCT --  PLT --  APTT --  LABPROT 18.0*  INR 1.46  HEPARINUNFRC --  CREATININE --  CKTOTAL --  CKMB --  TROPONINI --   Estimated Creatinine Clearance: 91.9 ml/min (by C-G formula based on Cr of 1).  Medical History: Past Medical History  Diagnosis Date  . Atrial fibrillation     chronic  . Chronic systolic heart failure     Ejection fraction 25-30% catheter November 2012 with moderate mitral regurgitation  . Diabetes mellitus     type 2  . Mitral regurgitation     moderate per echo in April of 2011  . Asthma   . Chronic anticoagulation     followed by Dr. Eloise Harman  . HTN (hypertension)   . Syncope   . Tachy-brady syndrome Sept 2012    with NSVT on holter    Medications:  Prescriptions prior to admission  Medication Sig Dispense Refill  . albuterol (PROVENTIL) (5 MG/ML) 0.5% nebulizer solution Take 2.5 mg by nebulization as needed.        . digoxin (LANOXIN) 0.125 MG tablet Take 1 tablet (125 mcg total) by mouth daily.  30 tablet  11  . Fluticasone-Salmeterol (ADVAIR DISKUS) 250-50 MCG/DOSE AEPB Inhale 1 puff into the lungs every 12 (twelve) hours.        . furosemide (LASIX) 40 MG tablet Take 40 mg by mouth daily.        . metoCLOPramide (REGLAN) 5 MG tablet Take 5 mg by mouth daily.        . nebivolol (BYSTOLIC) 5 MG tablet Take 5 mg by mouth daily.        Marland Kitchen olmesartan (BENICAR) 40 MG  tablet Take 40 mg by mouth as directed. TAKES MON, WED, FRI AND SUN      . roflumilast (DALIRESP) 500 MCG TABS tablet Take 500 mcg by mouth daily.        Marland Kitchen DISCONTD: aspirin 81 MG tablet Take 81 mg by mouth daily.        Marland Kitchen warfarin (COUMADIN) 10 MG tablet Take 10 mg by mouth as directed. WED AND FRIDAYS...7.5MG  ALL OTHER DAYS       . warfarin (COUMADIN) 7.5 MG tablet Take 7.5 mg by mouth as directed. 7.5 MG MON, TUES, THURS, SAT AND SUN...10MG  WED AND FRI         Assessment: S/p BivICD today. Resume Coumadin for afib as prior to admission. INR today 1.46 Goal of Therapy:  INR 2-3   Plan:  Coumadin 10mg  po x 1 tonight.  Merilynn Finland, [employer] 10/05/2011,5:00 PM

## 2011-10-05 NOTE — Brief Op Note (Signed)
10/05/2011  3:35 PM  PATIENT:  [name]  66 y.o. male  PRE-OPERATIVE DIAGNOSIS:  Heart Failure  POST-OPERATIVE DIAGNOSIS:  * No post-op diagnosis entered *  PROCEDURE:  Procedure(s): BI-VENTRICULAR IMPLANTABLE CARDIOVERTER DEFIBRILLATOR  (CRT-D)  SURGEON:  Surgeon(s): Duke Salvia, MD  PHYSICIAN ASSISTANT:   ASSISTANTS: none   ANESTHESIA:   local and IV sedation   DICTATION: .Other Dictation: Dictation Number [id number]  PLAN OF CARE: Admit for overnight observation

## 2011-10-05 NOTE — H&P (View-Only) (Signed)
HPI  [name] is a 65 y.o. male Seen at the request of Dr Jacinto Halim because of syncope.  He has a known nonischemic cardiomyopathy of long-standing with a recent catheterization last week confirming the absence of coronary disease who has chronic class 2-3 congestive failure. He also has permanent atrial fibrillation for which he takes aspirin and warfarin.  A few weeks ago he was standing in the kitchen fixing dinner for his son he turned and collapsed to the ground. He was out just for a few moments and was able to stand and continue his activities. He has a episode of remote /syncope a few years ago where he passed out with some warning  He has some nocturnal dyspnea that responds to the dilators. He is followed for asthma. He has some occasional peripheral edema. Unable to climb a flight of stairs without dyspnea. He denies chest pain. He has had no palpitations.  He snores. His daytime somnolence..  .  Past Medical History  Diagnosis Date  . Atrial fibrillation     chronic  . CHF (congestive heart failure)     chronic systolic dysfunction with EF of 35 to 40% in April of 2011  . Diabetes mellitus     type 2  . Mitral regurgitation     moderate per echo in April of 2011  . Asthma   . Chronic anticoagulation     followed by Dr. Eloise Harman  . HTN (hypertension)   . History of cardiac cath 2004    Normal coronaries with EF of 20% and 2+ mitral insufficiency  . 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 Tonsillectomy   . Inguinal hernia repair     right  . Repair of right index finger   . Surgery for an infected right sacroiliac joint   . Cardiac catheterization   . US echocardiography Sept 2012`    EF 30 to 35%    Current Outpatient Prescriptions  Medication Sig Dispense Refill  . albuterol (PROVENTIL) (5 MG/ML) 0.5% nebulizer solution Take 2.5 mg by nebulization as needed.        Marland Kitchen aspirin 81 MG tablet Take  81 mg by mouth daily.        . digoxin (LANOXIN) 0.125 MG tablet Take 1 tablet (125 mcg total) by mouth daily.  30 tablet  11  . Fluticasone-Salmeterol (ADVAIR DISKUS) 250-50 MCG/DOSE AEPB Inhale 1 puff into the lungs every 12 (twelve) hours.        . furosemide (LASIX) 40 MG tablet Take 40 mg by mouth daily.        . metoCLOPramide (REGLAN) 5 MG tablet Take 5 mg by mouth daily.        . nebivolol (BYSTOLIC) 5 MG tablet Take 5 mg by mouth daily.        Marland Kitchen olmesartan (BENICAR) 40 MG tablet Take 40 mg by mouth as directed. TAKES MON, WED, FRI AND SUN      . roflumilast (DALIRESP) 500 MCG TABS tablet Take 500 mcg by mouth daily.        Marland Kitchen warfarin (COUMADIN) 10 MG tablet Take 10 mg by mouth as directed. WED AND FRIDAYS...7.5MG  ALL OTHER DAYS       . warfarin (COUMADIN) 7.5 MG tablet Take 7.5 mg by mouth as directed. 7.5 MG MON, TUES, THURS, SAT AND SUN...10MG  WED AND FRI         Allergies  Allergen Reactions  . Nitroglycerin Other (See Comments)    syncope  . Ace Inhibitors Cough  . Beta Adrenergic Blockers Other (See Comments)    Asthma w/beta blockers    Review of Systems negative except from HPI and PMH  Physical Exam Well developed and well nourished obese older white male wearing a life vest in no acute distress HENT normal E scleral and icterus clear Neck Supple JVP 9-10cm; carotids brisk and full Clear to ausculation Regular rate and rhythm, 2/6 murmurs no gallups Soft with active bowel sounds No clubbing cyanosis Trace Edema Alert and oriented, grossly normal motor and sensory function Skin Warm and Dry LN neg Affect appropriate  Assessment and  Plan

## 2011-10-05 NOTE — Interval H&P Note (Signed)
History and Physical Interval Note:  10/05/2011 11:53 AM  [name]  has presented today for surgery, with the diagnosis of Heart Failure  The various methods of treatment have been discussed with the patient and family. After consideration of risks, benefits and other options for treatment, the patient has consented to  Procedure(s): BI-VENTRICULAR IMPLANTABLE CARDIOVERTER DEFIBRILLATOR  (CRT-D) as a surgical intervention .  The patients' history has been reviewed, patient examined, no change in status, stable for surgery.  I have reviewed the patients' chart and labs.  Questions were answered to the patient's satisfaction.     Sherryl Manges

## 2011-10-06 ENCOUNTER — Other Ambulatory Visit: Payer: Self-pay

## 2011-10-06 ENCOUNTER — Ambulatory Visit (HOSPITAL_COMMUNITY): Payer: BC Managed Care – PPO

## 2011-10-06 DIAGNOSIS — I428 Other cardiomyopathies: Secondary | ICD-10-CM

## 2011-10-06 NOTE — Progress Notes (Signed)
   ELECTROPHYSIOLOGY ROUNDING NOTE    Patient Name: [name] Date of Encounter: 10-06-2011    SUBJECTIVE:Tenderness at implant site.  Status post CRTD implantation 10-05-11 with Dr Graciela Husbands.  No events  TELEMETRY: Reviewed telemetry pt in afib with controlled ventricular response  Physical Exam: Filed Vitals:   10/06/11 0500 10/06/11 0759 10/06/11 0815 10/06/11 0821  BP:   115/59   Pulse:   94 94  Temp:      TempSrc:      Resp:   20   Height:      Weight: 218 lb 14.7 oz (99.3 kg)     SpO2:  92%      GEN- The patient is well appearing, alert and oriented x 3 today.   Head- normocephalic, atraumatic Eyes-  Sclera clear, conjunctiva pink Ears- hearing intact Oropharynx- clear Neck- supple, no JVP Lymph- no cervical lymphadenopathy Lungs- Clear to ausculation bilaterally, normal work of breathing Chest- ICD pocket is without hematoma and appears stable Heart- irregular rate and rhythm,   Extremities- no clubbing, cyanosis, or edema  ICD interrogation- reviewed in detail today   Intake/Output Summary (Last 24 hours) at 10/06/11 0739 Last data filed at 10/06/11 0500  Gross per 24 hour  Intake    480 ml  Output   1200 ml  Net   -720 ml    Radiology/Studies:  No PTx RV lead not well visualized,  Will repeat cxr  DEVICE INTERROGATION: Device interrogated by industry.  Lead values including impedence, sensing, threshold within normal values.  R waves decreased to 3.88mV true bipolar, around 5.73mV integrated bipolar.    Active Problems:  Atrial fibrillation  Systolic heart failure  Syncope and collapse  nonischemic cardiomyopathies    Assessment/Plan  Doing well s/p BiV ICD.  R waves are slightly decreased.  Will obtain CXR.  IF stable, will DC home today. He will follow-up with Dr Graciela Husbands in 2 weeks for wound check and to discuss AV nodal ablation. Resume coumadin.  Hillis Range, MD 9:59 AM 10/06/2011

## 2011-10-06 NOTE — Discharge Summary (Addendum)
ELECTROPHYSIOLOGY PROCEDURE DISCHARGE SUMMARY    Patient ID: [name],  MRN: [id number], DOB/AGE: 64/29/1947 64 y.o.  Admit date: 10/05/2011 Discharge date: 10/06/2011  Primary Cardiologist: Yates Decamp, MD Electrophysiologist: Sherryl Manges, MD  Primary Discharge Diagnosis:  Non ischemic cardiomyopathy, congestive heart failure, and syncope status post CRTD implantation this admission.  Secondary Discharge Diagnosis:  1.  Atrial fibrillation- chronic 2.  Diabetes mellitus- type II 3.  Moderate mitral regurgitation 4.  Asthma 5.  Chronic anticoagulation followed by Dr Eloise Harman 6.  Hypertension 7.  Non-sustained ventricular tachycardia identified on holter monitoring.    Procedures This Admission: 1.  Implantation of a Medtronic cardiac resynchronization therapy defibrillator on 10-05-2011 by Dr Graciela Husbands. The patient received a Medtronic [id number] CRTD with model number 1258 LV lead and model number 6947 RV lead.  The right atrial port was plugged.  DFT's were not carried out because of subtherapeutic INR on admission. The patient had no early apparent complications. 2.  Chest x-ray on 10-06-2011 (portable) which demonstrated no pneumothorax status post device implantation but poor visualization of RV lead. 3.  PA and lateral chest x-ray on 10-06-2011 with leads found to be in good position.   Brief HPI: [name] is a 65 y.o. male who was seen at the request of Dr Jacinto Halim because of syncope by Dr Graciela Husbands. He has a known nonischemic cardiomyopathy of long-standing with a recent catheterization last week confirming the absence of coronary disease who has chronic class 2-3 congestive failure. He also has permanent atrial fibrillation for which he takes aspirin and warfarin. A few weeks ago he was standing in the kitchen fixing dinner for his son he turned and collapsed to the ground. He was out just for a few moments and was able to stand and continue his activities. He has a episode  of remote /syncope a few years ago where he passed out with some warning.  Dr Graciela Husbands felt that with syncope in the context of significant nonischemic cardiomyopathy, ICD implantation would be appropriate.  He also has worn an outpatient holter monitor which demonstrated atrial fibrillation with a mean heart rate of 110bpm with excursions up to the 160 range.  Future AV nodal junction ablation was discussed with the patient as part of a treatment plan.  Risks, benefits, and alternatives to device implantation were reviewed with the patient and he wished to proceed.  Hospital Course: The patient was admitted on 10-05-2011 for planned implantation of a CRTD.  This was carried out by Dr Graciela Husbands with details as outlined above.  He was monitored on telemetry overnight which demonstrated atrial fibrillation with controlled ventricular response.  His device is programmed in the VVI mode until adequate rate control can be obtained.  CXR was done which demonstrated no pneumothorax status post device implant. His left chest was without hematoma or ecchymosis.  His device was interrogated and found to be functioning normally, albeit with decreased R wave amplitude.  He is programmed to sense integrated bipolar to improve sensing. Dr Johney Frame examined the patient on 10-06-2011 and considered him stable for discharge to home.  With syncope, driving would be prohibited, but patient states that he does not drive currently.  He is to resume his Coumadin with followup with Dr Eloise Harman.  He will have early follow up with Dr Graciela Husbands to determine plan for AV node ablation.   Discharge Vitals: Blood pressure 115/59, pulse 94, temperature 97.6 F (36.4 C), temperature source Oral, resp. rate 20, height 6\' 1"  (1.854  m), weight 218 lb 14.7 oz (99.3 kg), SpO2 92.00%.    Labs:   Lab Results  Component Value Date   WBC 6.3 09/30/2011   HGB 12.8* 09/30/2011   HCT 37.8* 09/30/2011   MCV 91.1 09/30/2011   PLT 169.0 09/30/2011    Lab  09/30/11 1615  NA 142  K 4.0  CL 110  CO2 25  BUN 23  CREATININE 1.0  CALCIUM 8.9  PROT --  BILITOT --  ALKPHOS --  ALT --  AST --  GLUCOSE 100*    Discharge Medications: Current Discharge Medication List    CONTINUE these medications which have NOT CHANGED   Details  albuterol (PROVENTIL) (5 MG/ML) 0.5% nebulizer solution Take 2.5 mg by nebulization every 6 (six) hours as needed. For wheezing     digoxin (LANOXIN) 0.125 MG tablet Take 1 tablet (125 mcg total) by mouth daily. Qty: 30 tablet, Refills: 11    fish oil-omega-3 fatty acids 1000 MG capsule Take 2 g by mouth 2 (two) times daily.      Fluticasone-Salmeterol (ADVAIR DISKUS) 250-50 MCG/DOSE AEPB Inhale 1 puff into the lungs every 12 (twelve) hours.      furosemide (LASIX) 40 MG tablet Take 40 mg by mouth daily.      metoCLOPramide (REGLAN) 5 MG tablet Take 5 mg by mouth daily.      nebivolol (BYSTOLIC) 5 MG tablet Take 5 mg by mouth daily.      olmesartan (BENICAR) 40 MG tablet Take 40 mg by mouth as directed. TAKES MON, WED, FRI AND SUN    Pseudoephedrine-Guaifenesin (MUCINEX D PO) Take 1 tablet by mouth daily as needed. For congestion     roflumilast (DALIRESP) 500 MCG TABS tablet Take 500 mcg by mouth daily.      !! warfarin (COUMADIN) 10 MG tablet Take 10 mg by mouth as directed. WED AND FRIDAYS...7.5MG  ALL OTHER DAYS     !! warfarin (COUMADIN) 7.5 MG tablet Take 7.5 mg by mouth as directed. 7.5 MG MON, TUES, THURS, SAT AND SUN...10MG  WED AND FRI      !! - Potential duplicate medications found. Please discuss with provider.    STOP taking these medications     aspirin 81 MG tablet         Disposition:  Discharge Orders    Future Appointments: Provider: Department: Dept Phone: Center:   10/18/2011 3:30 PM Vella Kohler [facility] [place] [phone] [id number]     Follow-up Information    Follow up with [facility] on 10/18/2011. (Device Clinic: 3:30pm)    Contact information:    [address] [place] [address] [phone]      Follow up with Sherryl Manges, MD. (we will arrange)    Contact information:   [address] [place] [address] [phone]          Duration of Discharge Encounter: Greater than 30 minutes including physician time.  Signed, Gypsy Balsam, RN, BSN 10/06/2011, 11:54 AM   I have seen, examined the patient, and reviewed the above discharge assessment and plan.  See my rounding note from today also.  Co Sign: Hillis Range, MD 10/06/2011 4:15 PM

## 2011-10-06 NOTE — Op Note (Signed)
NAMEHUSSIEN, [name] NO.:  [id number]  MEDICAL RECORD NO.:  [id number]  LOCATION:  3708                         FACILITY:  [facility]  PHYSICIAN:  Duke Salvia, MD, FACCDATE OF BIRTH:  10-17-1946  DATE OF PROCEDURE:  10/05/2011 DATE OF DISCHARGE:                              OPERATIVE REPORT   PREOPERATIVE DIAGNOSES:  Syncope with a nonischemic cardiomyopathy, atrial fibrillation with anticipated atrioventricular junction ablation.  POSTOPERATIVE DIAGNOSES:  Syncope with a nonischemic cardiomyopathy, atrial fibrillation with anticipated atrioventricular junction ablation.  PROCEDURE:  Single-chamber defibrillator implantation with left ventricular lead placement.  Following obtaining informed consent, the patient was brought to the electrophysiology laboratory and placed on the fluoroscopic table in supine position.  After routine prep and drape of the left upper chest, lidocaine was infiltrated in prepectoral subclavicular region.  Incision was made and carried down to layer of the prepectoral fascia using electrocautery and sharp dissection.  A pocket was formed similarly.  Hemostasis was obtained.  Thereafter attention was turned to gain access to the extrathoracic left subclavian vein which was accomplished without difficulty without the aspiration or puncture of the artery.  Two separate venipunctures were accomplished.  Guidewires were placed and retained.  Sequentially, a 32- [place] and 9.5 [place] sheath were placed, which were passed.  A Medtronic Sprint Quattro [id number] dual coil active fixation defibrillator lead, serial [id number] and a Medtronic extended hook coronary sinus cannulation catheter.  The defibrillator lead was manipulated to the right ventricular apex. The bipolar R-wave was 9.2 with a pace impedance of 672 with threshold 1.1 V at 0.5 msec, current at threshold was 1.6 mA.  There was no diaphragmatic pacing at 10 V.  The current of injury  was modest.  This lead was secured to the prepectoral fascia.  We then were able to get into the coronary sinus.  To be frank, it was more of luck than anything else because the coronary sinus os was displaced much anterior to where I thought it was.  In any case, we got in and took a venogram and there was a single lateral branch that coursed over at the apex of the heart.  In this vein, we initially placed a Medtronic 4396 lead, which failed to be able to be secured anywhere but at the apex.  We then used a [employer] Jude 1258, 86 cm LV lead, serial #[id number] and this was manipulated to a junction between the mid and distal third where a LV ring, RV coil configuration demonstrated an amplitude of 21 with a pace impedance of 490 and threshold of 0.4 at 0.5, current at threshold is 1.0 mA and there is no diaphragmatic pacing at 10 V.  This lead was secured to the prepectoral fascia, verified radiographically and with stable pacing parameters and this lead was secured to the prepectoral fascia with care made to advance the lead into the right atrium which was enormous.  The leads were then attached to a Medtronic D314TRG defibrillator, serial #[id number].  The RA port was plugged.  The RV lead demonstrated amplitude of 7.1 with a pacing impedance of 494 with threshold of 0.75 at 0.4.  The LV impedance was 342 with a threshold  of 0.75 at 0.4.  Distal coil impedance was 54. Proximal coil impedance was 47, a little unusual but the heart __________ proximal coil sat into the RA.  Because the patient was in atrial fibrillation with a subtherapeutic INR, it was elected not to undertake DFT testing.  The leads in the pulse generator were then placed in the pocket which had been copiously irrigated with antibiotic containing saline solution.  The device was secured to the prepectoral fascia and the wound was then closed in 2 layers in normal fashion.  The wound was washed, dried, and a benzoin and  Steri-Strip dressing was applied.  Needle counts, sponge counts, and instrument counts were correct at the end of the procedure.  The patient tolerated the procedure without apparent complication.     Duke Salvia, MD, [facility]     SCK/MEDQ  D:  10/05/2011  T:  10/06/2011  Job:  [id number]

## 2011-10-18 ENCOUNTER — Encounter: Payer: Self-pay | Admitting: *Deleted

## 2011-10-18 ENCOUNTER — Ambulatory Visit (INDEPENDENT_AMBULATORY_CARE_PROVIDER_SITE_OTHER): Payer: BC Managed Care – PPO | Admitting: *Deleted

## 2011-10-18 ENCOUNTER — Encounter: Payer: Self-pay | Admitting: Internal Medicine

## 2011-10-18 DIAGNOSIS — I428 Other cardiomyopathies: Secondary | ICD-10-CM

## 2011-10-18 DIAGNOSIS — R55 Syncope and collapse: Secondary | ICD-10-CM

## 2011-10-18 LAB — ICD DEVICE OBSERVATION: CHARGE TIME: 8.8 s

## 2011-10-18 NOTE — Progress Notes (Signed)
Wound check icd in clinic.  

## 2011-11-12 ENCOUNTER — Encounter: Payer: BC Managed Care – PPO | Admitting: Internal Medicine

## 2011-12-21 ENCOUNTER — Encounter: Payer: Self-pay | Admitting: Internal Medicine

## 2011-12-21 ENCOUNTER — Ambulatory Visit (INDEPENDENT_AMBULATORY_CARE_PROVIDER_SITE_OTHER): Payer: BC Managed Care – PPO | Admitting: Internal Medicine

## 2011-12-21 DIAGNOSIS — Z95810 Presence of automatic (implantable) cardiac defibrillator: Secondary | ICD-10-CM | POA: Insufficient documentation

## 2011-12-21 DIAGNOSIS — I428 Other cardiomyopathies: Secondary | ICD-10-CM

## 2011-12-21 DIAGNOSIS — I4891 Unspecified atrial fibrillation: Secondary | ICD-10-CM

## 2011-12-21 LAB — ICD DEVICE OBSERVATION
AL IMPEDENCE ICD: 4047 Ohm
BATTERY VOLTAGE: 3.2011 V
LV LEAD IMPEDENCE ICD: 437 Ohm
LV LEAD THRESHOLD: 1 V
RV LEAD AMPLITUDE: 8.8 mv
RV LEAD THRESHOLD: 1 V
TOT-0002: 0
TOT-0006: 20121218000000
TZAT-0001ATACH: 1
TZAT-0002ATACH: NEGATIVE
TZAT-0002ATACH: NEGATIVE
TZAT-0002ATACH: NEGATIVE
TZAT-0002SLOWVT: NEGATIVE
TZAT-0012FASTVT: 170 ms
TZAT-0012SLOWVT: 170 ms
TZAT-0018ATACH: NEGATIVE
TZAT-0018ATACH: NEGATIVE
TZAT-0018FASTVT: NEGATIVE
TZAT-0018SLOWVT: NEGATIVE
TZAT-0019ATACH: 6 V
TZAT-0019ATACH: 6 V
TZAT-0019FASTVT: 8 V
TZAT-0019SLOWVT: 8 V
TZAT-0020ATACH: 1.5 ms
TZAT-0020ATACH: 1.5 ms
TZAT-0020FASTVT: 1.5 ms
TZAT-0020SLOWVT: 1.5 ms
TZON-0003SLOWVT: 360 ms
TZST-0001ATACH: 5
TZST-0001FASTVT: 2
TZST-0001FASTVT: 5
TZST-0001FASTVT: 6
TZST-0001SLOWVT: 3
TZST-0001SLOWVT: 5
TZST-0002ATACH: NEGATIVE
TZST-0002ATACH: NEGATIVE
TZST-0002FASTVT: NEGATIVE
TZST-0002FASTVT: NEGATIVE
TZST-0002SLOWVT: NEGATIVE
TZST-0002SLOWVT: NEGATIVE
TZST-0002SLOWVT: NEGATIVE
VENTRICULAR PACING ICD: 0 pct
VF: 0

## 2011-12-21 MED ORDER — NEBIVOLOL HCL 20 MG PO TABS: 1.0000 | ORAL_TABLET | Freq: Every day | ORAL | Status: DC

## 2011-12-21 NOTE — Assessment & Plan Note (Signed)
The patient's device was interrogated and the information was fully reviewed.  The device was reprogrammed to increase her lower rate from 40-60 and decreased output

## 2011-12-21 NOTE — Assessment & Plan Note (Signed)
Much improved; currently class II

## 2011-12-21 NOTE — Progress Notes (Signed)
HPI  [name] is a 66 y.o. male Seen in followup for syncope in the setting of nonischemic cardiomyopathy and chronic congestive heart failure and permanent atrial fibrillation. He underwent ICD implantation because of the syncope and CRT implantation as he had a history of rapid atrial fibrillation with poorly controlled rates because of underlying propensity towards bradycardia. AV junction ablation was not initially undertaken and his device had been programmed VVI 40.  He feels consider.ably better. At his last visit diltiazem was resumed  Past Medical History  Diagnosis Date  . Chronic systolic heart failure     Ejection fraction 25-30% catheter November 2012 with moderate mitral regurgitation  . Mitral regurgitation     moderate per echo in April of 2011  . Asthma   . Chronic anticoagulation     followed by Dr. Eloise Harman  . HTN (hypertension)   . Syncope   . Myocardial infarction ~ 2005  . Diabetes mellitus 10/05/11    "on the verge; don't take RX for it"  . Arthritis   . Atrial fibrillation     chronic  . 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 Repair of right index finger ~ 1990's  . Surgery for an infected right sacroiliac joint ~ 2006  . Cardiac catheterization   . US echocardiography Sept 2012`    EF 30 to 35%  . Insert / replace / remove pacemaker 10/05/11    "first time"  . Cardiac defibrillator placement 10/05/11  . Tonsillectomy ~ 1955  . Inguinal hernia repair 1990's    right    Current Outpatient Prescriptions  Medication Sig Dispense Refill  . albuterol (PROVENTIL) (5 MG/ML) 0.5% nebulizer solution Take 2.5 mg by nebulization every 6 (six) hours as needed. For wheezing       . digoxin (LANOXIN) 0.125 MG tablet Take 250 mcg by mouth daily.      Marland Kitchen diltiazem (DILACOR XR) 180 MG 24 hr capsule Take 180 mg by mouth daily.      . fish oil-omega-3 fatty acids 1000 MG capsule Take 1,200 mg by  mouth 2 (two) times daily.       . Fluticasone-Salmeterol (ADVAIR DISKUS) 250-50 MCG/DOSE AEPB Inhale 1 puff into the lungs every 12 (twelve) hours.        . furosemide (LASIX) 40 MG tablet Take 40 mg by mouth daily.        . metoCLOPramide (REGLAN) 5 MG tablet Take 5 mg by mouth daily.        Marland Kitchen olmesartan (BENICAR) 40 MG tablet Take 40 mg by mouth as directed. TAKES MON, WED, FRI AND SUN      . Pseudoephedrine-Guaifenesin (MUCINEX D PO) Take 1 tablet by mouth daily as needed. For congestion       . roflumilast (DALIRESP) 500 MCG TABS tablet Take 500 mcg by mouth daily.        Marland Kitchen warfarin (COUMADIN) 10 MG tablet Take 10 mg by mouth as directed. WED AND FRIDAYS...7.5MG  ALL OTHER DAYS       . warfarin (COUMADIN) 7.5 MG tablet Take 7.5 mg by mouth as directed. 7.5 MG MON, TUES, THURS, SAT AND SUN...10MG  WED AND FRI         Allergies  Allergen Reactions  . Nitroglycerin Other (See Comments)    syncope  . Ace Inhibitors Cough  . Beta Adrenergic Blockers Other (See Comments)    Asthma w/beta blockers  Review of Systems negative except from HPI and PMH  Physical Exam BP 116/67  Pulse 42  Ht 6\' 1"  (1.854 m)  Wt 217 lb 12.8 oz (98.793 kg)  BMI 28.74 kg/m2 Well developed and well nourished in no acute distress HENT normal E scleral and icterus clear Neck Supple JVP flat; carotids brisk and full Clear to ausculation irregularly irregular no murmurs gallops or rub Soft with active bowel sounds No clubbing cyanosis none Edema Alert and oriented, grossly normal motor and sensory function Skin Warm and Dry   Assessment and  Plan

## 2011-12-21 NOTE — Assessment & Plan Note (Signed)
Permanent with improved rate control. I approximate somewhere 80-85% of his beats are slower than 100 beats per minute. We'll increase his bystolic from 10-20 mg daily to see if we can further augment rate control. He is on diltiazem which I worry a little bit about in terms of its negative inotropic effects. When he sees Dr. Jacinto Halim. again in May, I will ask that we undertake an echo to look at his left ventricular function. If he requires diltiazem to maintain rate control and he continues to have LV dysfunction at that point we could consider AV junction ablation

## 2011-12-21 NOTE — Assessment & Plan Note (Signed)
Continue currrent meds 

## 2011-12-21 NOTE — Assessment & Plan Note (Signed)
No recurrent syncope 

## 2011-12-21 NOTE — Patient Instructions (Signed)
Your physician has recommended you make the following change in your medication:  1) Increase bystolic to 20 mg once daily.  Your physician recommends that you schedule a follow-up appointment in: June with Dr. Graciela Husbands.

## 2012-01-13 ENCOUNTER — Encounter: Payer: BC Managed Care – PPO | Admitting: Internal Medicine

## 2012-04-14 ENCOUNTER — Ambulatory Visit (INDEPENDENT_AMBULATORY_CARE_PROVIDER_SITE_OTHER): Payer: BC Managed Care – PPO | Admitting: Internal Medicine

## 2012-04-14 ENCOUNTER — Encounter: Payer: Self-pay | Admitting: Internal Medicine

## 2012-04-14 DIAGNOSIS — I5022 Chronic systolic (congestive) heart failure: Secondary | ICD-10-CM

## 2012-04-14 DIAGNOSIS — I4891 Unspecified atrial fibrillation: Secondary | ICD-10-CM

## 2012-04-14 DIAGNOSIS — I428 Other cardiomyopathies: Secondary | ICD-10-CM

## 2012-04-14 DIAGNOSIS — Z95810 Presence of automatic (implantable) cardiac defibrillator: Secondary | ICD-10-CM

## 2012-04-14 LAB — ICD DEVICE OBSERVATION
AL IMPEDENCE ICD: 4047 Ohm
ATRIAL PACING ICD: 0 pct
CHARGE TIME: 8.728 s
PACEART VT: 0
RV LEAD THRESHOLD: 1.5 V
TOT-0001: 0
TOT-0002: 0
TZAT-0001ATACH: 2
TZAT-0001FASTVT: 1
TZAT-0002ATACH: NEGATIVE
TZAT-0002ATACH: NEGATIVE
TZAT-0002FASTVT: NEGATIVE
TZAT-0002SLOWVT: NEGATIVE
TZAT-0018ATACH: NEGATIVE
TZAT-0018ATACH: NEGATIVE
TZAT-0019ATACH: 6 V
TZAT-0019ATACH: 6 V
TZAT-0019SLOWVT: 8 V
TZAT-0020ATACH: 1.5 ms
TZAT-0020FASTVT: 1.5 ms
TZAT-0020SLOWVT: 1.5 ms
TZON-0003VSLOWVT: 450 ms
TZON-0004VSLOWVT: 20
TZON-0005SLOWVT: 12
TZST-0001ATACH: 5
TZST-0001ATACH: 6
TZST-0001FASTVT: 3
TZST-0001FASTVT: 5
TZST-0001SLOWVT: 3
TZST-0002ATACH: NEGATIVE
TZST-0002FASTVT: NEGATIVE
TZST-0002SLOWVT: NEGATIVE
TZST-0002SLOWVT: NEGATIVE
TZST-0002SLOWVT: NEGATIVE
VENTRICULAR PACING ICD: 0 pct

## 2012-04-14 NOTE — Assessment & Plan Note (Signed)
Permanent. I would recommend that we consider NOAC as an alternative to Coumadin

## 2012-04-14 NOTE — Progress Notes (Signed)
HPI  [name] is a 66 y.o. male Seen in followup for syncope in the setting of nonischemic cardiomyopathy and chronic congestive heart failure and permanent atrial fibrillation. He underwent ICD implantation because of the syncope and CRT implantation as he had a history of rapid atrial fibrillation with poorly controlled rates because of underlying propensity towards bradycardia. AV junction ablation was not initially undertaken and his device had been programmed VVI 40.  He underwent echocardiogram by Dr. Ottis Stain with EF 62%   Past Medical History  Diagnosis Date  . Chronic systolic heart failure     Ejection fraction 25-30% catheter November 2012 with moderate mitral regurgitation  . Mitral regurgitation     moderate per echo in April of 2011  . Asthma   . Chronic anticoagulation     followed by Dr. Eloise Harman  . HTN (hypertension)   . Syncope   . Myocardial infarction ~ 2005  . Diabetes mellitus 10/05/11    "on the verge; don't take RX for it"  . Arthritis   . Atrial fibrillation     chronic  . 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 Repair of right index finger ~ 1990's  . Surgery for an infected right sacroiliac joint ~ 2006  . Cardiac catheterization   . US echocardiography Sept 2012`    EF 30 to 35%  . Insert / replace / remove pacemaker 10/05/11    "first time"  . Cardiac defibrillator placement 10/05/11  . Tonsillectomy ~ 1955  . Inguinal hernia repair 1990's    right    Current Outpatient Prescriptions  Medication Sig Dispense Refill  . albuterol (PROVENTIL) (5 MG/ML) 0.5% nebulizer solution Take 2.5 mg by nebulization every 6 (six) hours as needed. For wheezing       . digoxin (LANOXIN) 0.125 MG tablet Take 250 mcg by mouth daily.      Marland Kitchen diltiazem (DILACOR XR) 180 MG 24 hr capsule Take 180 mg by mouth daily.      . fish oil-omega-3 fatty acids 1000 MG capsule Take 1,200 mg by mouth 2 (two) times  daily.       . Fluticasone-Salmeterol (ADVAIR DISKUS) 250-50 MCG/DOSE AEPB Inhale 1 puff into the lungs every 12 (twelve) hours.        . furosemide (LASIX) 40 MG tablet Take 40 mg by mouth daily.        . metoCLOPramide (REGLAN) 5 MG tablet Take 5 mg by mouth daily.        Marland Kitchen olmesartan (BENICAR) 40 MG tablet Take 40 mg by mouth as directed. TAKES MON, WED, FRI AND SUN      . Pseudoephedrine-Guaifenesin (MUCINEX D PO) Take 1 tablet by mouth daily as needed. For congestion       . roflumilast (DALIRESP) 500 MCG TABS tablet Take 500 mcg by mouth daily.        Marland Kitchen warfarin (COUMADIN) 7.5 MG tablet Take 7.5 mg by mouth as directed. 7.5 MG MON, TUES, THURS, SAT AND SUN...10MG  WED AND FRI       . DISCONTD: warfarin (COUMADIN) 10 MG tablet Take 10 mg by mouth as directed. WED AND FRIDAYS...7.5MG  ALL OTHER DAYS         Allergies  Allergen Reactions  . Nitroglycerin Other (See Comments)    syncope  . Ace Inhibitors Cough  . Beta Adrenergic Blockers Other (See Comments)    Asthma w/beta blockers  Review of Systems negative except from HPI and PMH  Physical Exam BP 98/60  Pulse 56  Ht 6\' 1"  (1.854 m)  Wt 215 lb (97.523 kg)  BMI 28.37 kg/m2 Well developed and well nourished in no acute distress HENT normal E scleral and icterus clear Neck Supple JVP flat; carotids brisk and full Clear to ausculationIr IRRegular rate and rhythm, no murmurs gallops or rub Soft with active bowel sounds No clubbing cyanosis none Edema Alert and oriented, grossly normal motor and sensory function Skin Warm and Dry    Electrocardiogram Atrial fibrillation controlled ventricular response and pacing at about 60 beats per minute occasional couplets  Assessment and  Plan

## 2012-04-14 NOTE — Assessment & Plan Note (Signed)
The patient's device was interrogated.  The information was reviewed. No changes were made in the programming.    

## 2012-04-14 NOTE — Patient Instructions (Signed)
Your physician wants you to follow-up in: 6 months with Dr. Klein. You will receive a reminder letter in the mail two months in advance. If you don't receive a letter, please call our office to schedule the follow-up appointment.  Your physician recommends that you continue on your current medications as directed. Please refer to the Current Medication list given to you today.  

## 2012-04-14 NOTE — Assessment & Plan Note (Signed)
The ejection fraction has improved significantly. This is concurred with control of his heart rate. I am not sure what happened. He was cardioverted to sinus rhythm at the time of ICD implantation and had been some persistence of sinus rhythm and controlled about recovery of his heart muscle.  In any case we reviewed the importance of close observation and good control of his heart rate so to prevent the redevelopment of tachycardia cardiomyopathy.

## 2012-07-17 ENCOUNTER — Encounter: Payer: BC Managed Care – PPO | Admitting: *Deleted

## 2012-07-21 ENCOUNTER — Encounter: Payer: Self-pay | Admitting: *Deleted

## 2012-07-29 ENCOUNTER — Encounter: Payer: Self-pay | Admitting: Internal Medicine

## 2012-07-31 ENCOUNTER — Ambulatory Visit (INDEPENDENT_AMBULATORY_CARE_PROVIDER_SITE_OTHER): Payer: BC Managed Care – PPO | Admitting: *Deleted

## 2012-07-31 DIAGNOSIS — I428 Other cardiomyopathies: Secondary | ICD-10-CM

## 2012-07-31 DIAGNOSIS — Z95810 Presence of automatic (implantable) cardiac defibrillator: Secondary | ICD-10-CM

## 2012-08-04 LAB — REMOTE ICD DEVICE
ATRIAL PACING ICD: 0 pct
CHARGE TIME: 8.728 s
FVT: 0
PACEART VT: 0
TOT-0001: 0
TZAT-0001ATACH: 2
TZAT-0001ATACH: 3
TZAT-0001FASTVT: 1
TZAT-0001SLOWVT: 1
TZAT-0002ATACH: NEGATIVE
TZAT-0002FASTVT: NEGATIVE
TZAT-0012ATACH: 150 ms
TZAT-0012ATACH: 150 ms
TZAT-0018ATACH: NEGATIVE
TZAT-0018ATACH: NEGATIVE
TZAT-0018SLOWVT: NEGATIVE
TZAT-0019FASTVT: 8 V
TZAT-0019SLOWVT: 8 V
TZAT-0020ATACH: 1.5 ms
TZON-0003VSLOWVT: 450 ms
TZON-0004SLOWVT: 16
TZON-0004VSLOWVT: 20
TZON-0005SLOWVT: 12
TZST-0001ATACH: 6
TZST-0001FASTVT: 3
TZST-0001SLOWVT: 2
TZST-0001SLOWVT: 4
TZST-0001SLOWVT: 6
TZST-0002ATACH: NEGATIVE
TZST-0002FASTVT: NEGATIVE
TZST-0002FASTVT: NEGATIVE
TZST-0002FASTVT: NEGATIVE
TZST-0002FASTVT: NEGATIVE
TZST-0002SLOWVT: NEGATIVE
TZST-0002SLOWVT: NEGATIVE
VENTRICULAR PACING ICD: 0 pct
VF: 0

## 2012-08-18 ENCOUNTER — Encounter: Payer: Self-pay | Admitting: *Deleted

## 2012-08-20 ENCOUNTER — Other Ambulatory Visit: Payer: Self-pay | Admitting: Internal Medicine

## 2012-08-22 ENCOUNTER — Other Ambulatory Visit: Payer: Self-pay

## 2012-10-25 ENCOUNTER — Encounter: Payer: Self-pay | Admitting: Internal Medicine

## 2012-10-25 ENCOUNTER — Ambulatory Visit (INDEPENDENT_AMBULATORY_CARE_PROVIDER_SITE_OTHER): Payer: BC Managed Care – PPO | Admitting: Internal Medicine

## 2012-10-25 VITALS — BP 111/75 | HR 87 | Ht 73.0 in | Wt 225.8 lb

## 2012-10-25 DIAGNOSIS — Z95810 Presence of automatic (implantable) cardiac defibrillator: Secondary | ICD-10-CM

## 2012-10-25 DIAGNOSIS — I5042 Chronic combined systolic (congestive) and diastolic (congestive) heart failure: Secondary | ICD-10-CM | POA: Insufficient documentation

## 2012-10-25 DIAGNOSIS — I428 Other cardiomyopathies: Secondary | ICD-10-CM

## 2012-10-25 DIAGNOSIS — I4891 Unspecified atrial fibrillation: Secondary | ICD-10-CM

## 2012-10-25 DIAGNOSIS — I509 Heart failure, unspecified: Secondary | ICD-10-CM

## 2012-10-25 HISTORY — DX: Chronic combined systolic (congestive) and diastolic (congestive) heart failure: I50.42

## 2012-10-25 LAB — ICD DEVICE OBSERVATION
CHARGE TIME: 9.058 s
FVT: 0
RV LEAD AMPLITUDE: 8.8 mv
RV LEAD IMPEDENCE ICD: 399 Ohm
RV LEAD THRESHOLD: 1.5 V
TOT-0001: 0
TOT-0006: 20121218000000
TZAT-0001ATACH: 1
TZAT-0001ATACH: 3
TZAT-0002ATACH: NEGATIVE
TZAT-0002ATACH: NEGATIVE
TZAT-0002ATACH: NEGATIVE
TZAT-0002SLOWVT: NEGATIVE
TZAT-0012ATACH: 150 ms
TZAT-0012ATACH: 150 ms
TZAT-0012FASTVT: 170 ms
TZAT-0012SLOWVT: 170 ms
TZAT-0018ATACH: NEGATIVE
TZAT-0018ATACH: NEGATIVE
TZAT-0018ATACH: NEGATIVE
TZAT-0018FASTVT: NEGATIVE
TZAT-0018SLOWVT: NEGATIVE
TZAT-0019ATACH: 6 V
TZAT-0019ATACH: 6 V
TZAT-0019FASTVT: 8 V
TZAT-0019SLOWVT: 8 V
TZAT-0020FASTVT: 1.5 ms
TZON-0003ATACH: 350 ms
TZST-0001ATACH: 4
TZST-0001ATACH: 5
TZST-0001FASTVT: 3
TZST-0001FASTVT: 5
TZST-0001FASTVT: 6
TZST-0001SLOWVT: 4
TZST-0001SLOWVT: 5
TZST-0001SLOWVT: 6
TZST-0002ATACH: NEGATIVE
TZST-0002ATACH: NEGATIVE
TZST-0002FASTVT: NEGATIVE
TZST-0002FASTVT: NEGATIVE
TZST-0002SLOWVT: NEGATIVE
TZST-0002SLOWVT: NEGATIVE

## 2012-10-25 NOTE — Progress Notes (Signed)
Patient Care Team: Jarome Matin, MD as PCP - General (Internal Medicine)   HPI  [name] is a 67 y.o. male Seen in followup for CRT-D implanted because of a nonischemic cardiomyopathy chronic congestive heart failure and poorly controlled atrial fibrillation for which she was scheduled to undergo AV junction ablation. Subsequent normalization however of left ventricular function prompted [place] to defer this. He is ejection fraction at last echo was 62%. His repeat echo pending  The patient denies chest pain, shortness of breath, nocturnal dyspnea, orthopnea or peripheral edema.  There have been no palpitations, lightheadedness or syncope.    Past Medical History  Diagnosis Date  . Chronic systolic heart failure     Ejection fraction 25-30% catheter November 2012 with moderate mitral regurgitation  . Mitral regurgitation     moderate per echo in April of 2011  . Asthma   . Chronic anticoagulation     followed by Dr. Eloise Harman  . HTN (hypertension)   . Syncope   . Myocardial infarction ~ 2005  . Diabetes mellitus 10/05/11    "on the verge; don't take RX for it"  . Arthritis   . Atrial fibrillation     chronic  . 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 Repair of right index finger ~ 1990's  . Surgery for an infected right sacroiliac joint ~ 2006  . Cardiac catheterization   . US echocardiography Sept 2012`    EF 30 to 35%  . Insert / replace / remove pacemaker 10/05/11    "first time"  . Cardiac defibrillator placement 10/05/11  . Tonsillectomy ~ 1955  . Inguinal hernia repair 1990's    right    Current Outpatient Prescriptions  Medication Sig Dispense Refill  . albuterol (PROVENTIL) (5 MG/ML) 0.5% nebulizer solution Take 2.5 mg by nebulization every 6 (six) hours as needed. For wheezing       . BYSTOLIC 20 MG TABS take 1 tablet by mouth once daily  30 tablet  6  . digoxin (LANOXIN) 0.25 MG tablet Take  0.25 mg by mouth daily.      Marland Kitchen diltiazem (CARDIZEM CD) 180 MG 24 hr capsule As directed      . fish oil-omega-3 fatty acids 1000 MG capsule Take 1,200 mg by mouth 2 (two) times daily.       . Fluticasone-Salmeterol (ADVAIR DISKUS) 250-50 MCG/DOSE AEPB Inhale 1 puff into the lungs every 12 (twelve) hours.        . furosemide (LASIX) 40 MG tablet Take 40 mg by mouth daily.        . metoCLOPramide (REGLAN) 5 MG tablet Take 5 mg by mouth daily.        Marland Kitchen olmesartan (BENICAR) 40 MG tablet Take 40 mg by mouth as directed. TAKES MON, WED, FRI AND SUN      . Pseudoephedrine-Guaifenesin (MUCINEX D PO) Take 1 tablet by mouth daily as needed. For congestion       . roflumilast (DALIRESP) 500 MCG TABS tablet Take 500 mcg by mouth daily.        Marland Kitchen warfarin (COUMADIN) 7.5 MG tablet Take 7.5 mg by mouth as directed. 7.5 MG MON, TUES, THURS, SAT AND SUN...10MG  WED AND FRI         Allergies  Allergen Reactions  . Nitroglycerin Other (See Comments)    syncope  . Ace Inhibitors Cough  . Beta Adrenergic Blockers Other (See  Comments)    Asthma w/beta blockers    Review of Systems negative except from HPI and PMH  Physical Exam BP 111/75  Pulse 87  Ht 6\' 1"  (1.854 m)  Wt 225 lb 12.8 oz (102.422 kg)  BMI 29.79 kg/m2 Well developed and well nourished in no acute distress HENT normal E scleral and icterus clear Neck Supple JVP flat; carotids brisk and full Clear to ausculation  Regular rate and rhythm, no murmurs gallops or rub Soft with active bowel sounds No clubbing cyanosis none Edema Alert and oriented, grossly normal motor and sensory function Skin Warm and Dry    Assessment and  Plan

## 2012-10-25 NOTE — Assessment & Plan Note (Signed)
LV function improved would continue current medications might be appropriate to consider stopping the digoxin

## 2012-10-25 NOTE — Assessment & Plan Note (Signed)
.  dfnb The patient's device was interrogated.  The information was reviewed. No changes were made in the programming.    

## 2012-10-25 NOTE — Assessment & Plan Note (Signed)
Permanent atrial fibrillation  f remains on warfarin.  At this juncture heart rate appears to be adequately controlled so would   NOT anticipate AV junction ablation.  If there is deterioration in LV function and or  the development of symptoms, it is suggested that greater than 98% ventricular pacing may be necessary to accomplish the full benefits of cardiac resynchronization and that my in fact required AV junction ablation

## 2012-10-25 NOTE — Patient Instructions (Signed)
Remote monitoring is used to monitor your Pacemaker of ICD from home. This monitoring reduces the number of office visits required to check your device to one time per year. It allows [place] to keep an eye on the functioning of your device to ensure it is working properly. You are scheduled for a device check from home on January 22, 2013. You may send your transmission at any time that day. If you have a wireless device, the transmission will be sent automatically. After your physician reviews your transmission, you will receive a postcard with your next transmission date.  Your physician wants you to follow-up in:1 year with Dr. Graciela Husbands. You will receive a reminder letter in the mail two months in advance. If you don't receive a letter, please call our office to schedule the follow-up appointment.  Your physician recommends that you continue on your current medications as directed. Please refer to the Current Medication list given to you today.

## 2012-12-08 IMAGING — CR DG CHEST 2V
2 series · 2 of 2 positions shown · non-contrast
Comparison: 08/06/2008.

CLINICAL DATA: Left chest pain.  Dizziness.

CHEST - 2 VIEW

[w chest pa]
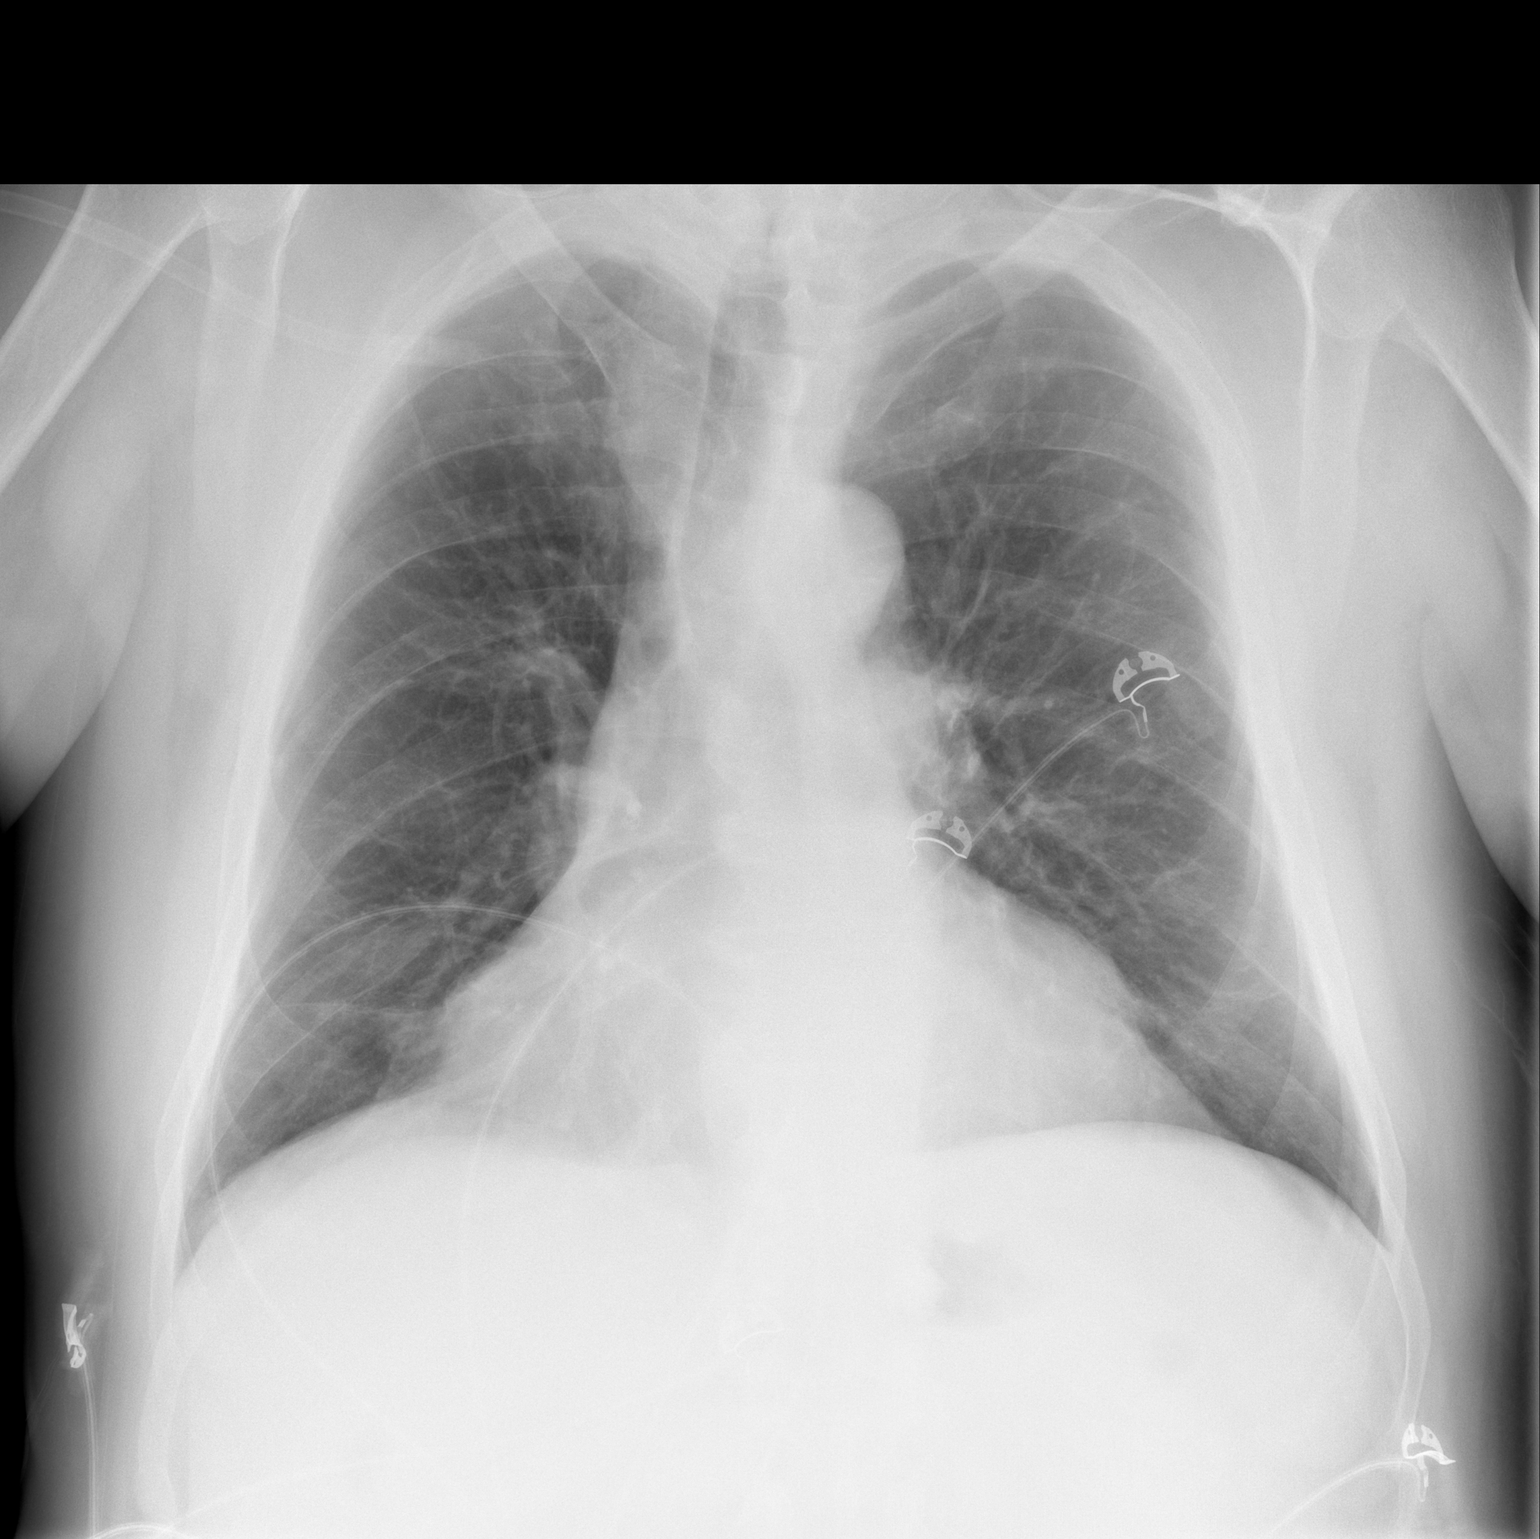

[w chest lat]
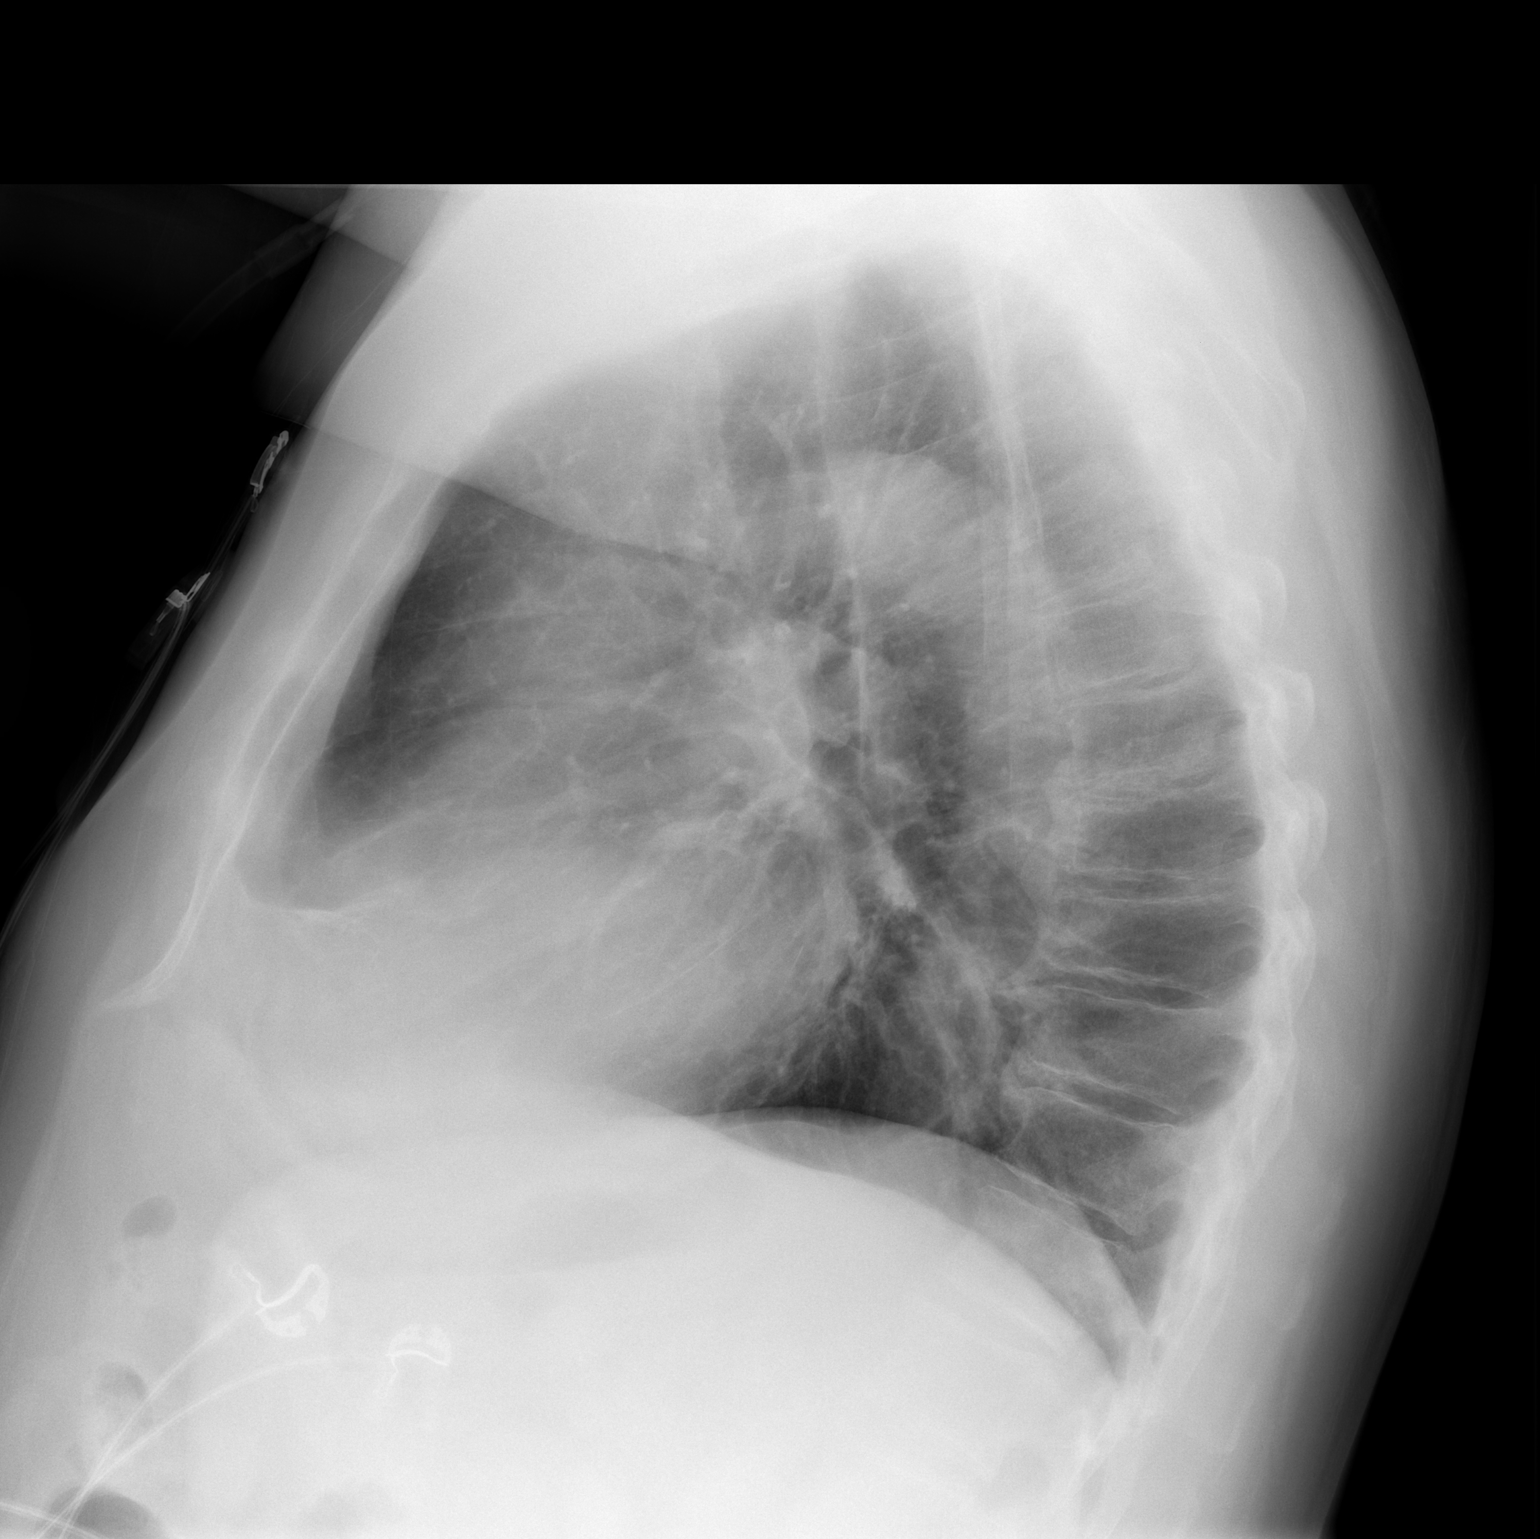

[2 of 2 positions shown; findings below may reference images not displayed]

FINDINGS: Trachea is midline.  Heart is mildly enlarged, stable.
Probable bibasilar scarring, right greater than left.  No airspace
consolidation or pleural fluid.
IMPRESSION: No acute findings.

## 2013-01-22 ENCOUNTER — Ambulatory Visit (INDEPENDENT_AMBULATORY_CARE_PROVIDER_SITE_OTHER): Payer: BC Managed Care – PPO | Admitting: *Deleted

## 2013-01-22 ENCOUNTER — Encounter: Payer: Self-pay | Admitting: Internal Medicine

## 2013-01-22 ENCOUNTER — Other Ambulatory Visit: Payer: Self-pay

## 2013-01-22 DIAGNOSIS — I509 Heart failure, unspecified: Secondary | ICD-10-CM

## 2013-01-22 DIAGNOSIS — Z95810 Presence of automatic (implantable) cardiac defibrillator: Secondary | ICD-10-CM

## 2013-01-22 DIAGNOSIS — I428 Other cardiomyopathies: Secondary | ICD-10-CM

## 2013-01-31 LAB — REMOTE ICD DEVICE
AL IMPEDENCE ICD: 4047 Ohm
ATRIAL PACING ICD: 0 pct
BATTERY VOLTAGE: 3.1398 V
CHARGE TIME: 9.058 s
FVT: 0
LV LEAD IMPEDENCE ICD: 494 Ohm
LV LEAD THRESHOLD: 0.75 V
PACEART VT: 0
RV LEAD AMPLITUDE: 8.5 mv
RV LEAD IMPEDENCE ICD: 380 Ohm
RV LEAD THRESHOLD: 1.375 V
TOT-0001: 0
TOT-0002: 0
TOT-0006: 20121218000000
TZAT-0001ATACH: 1
TZAT-0001ATACH: 2
TZAT-0001ATACH: 3
TZAT-0001FASTVT: 1
TZAT-0001SLOWVT: 1
TZAT-0002ATACH: NEGATIVE
TZAT-0002ATACH: NEGATIVE
TZAT-0002ATACH: NEGATIVE
TZAT-0002FASTVT: NEGATIVE
TZAT-0002SLOWVT: NEGATIVE
TZAT-0012ATACH: 150 ms
TZAT-0012ATACH: 150 ms
TZAT-0012ATACH: 150 ms
TZAT-0012FASTVT: 170 ms
TZAT-0012SLOWVT: 170 ms
TZAT-0018ATACH: NEGATIVE
TZAT-0018ATACH: NEGATIVE
TZAT-0018ATACH: NEGATIVE
TZAT-0018FASTVT: NEGATIVE
TZAT-0018SLOWVT: NEGATIVE
TZAT-0019ATACH: 6 V
TZAT-0019ATACH: 6 V
TZAT-0019ATACH: 6 V
TZAT-0019FASTVT: 8 V
TZAT-0019SLOWVT: 8 V
TZAT-0020ATACH: 1.5 ms
TZAT-0020ATACH: 1.5 ms
TZAT-0020ATACH: 1.5 ms
TZAT-0020FASTVT: 1.5 ms
TZAT-0020SLOWVT: 1.5 ms
TZON-0003ATACH: 350 ms
TZON-0003SLOWVT: 360 ms
TZON-0003VSLOWVT: 450 ms
TZON-0004SLOWVT: 32
TZON-0004VSLOWVT: 20
TZON-0005SLOWVT: 12
TZST-0001ATACH: 4
TZST-0001ATACH: 5
TZST-0001ATACH: 6
TZST-0001FASTVT: 2
TZST-0001FASTVT: 3
TZST-0001FASTVT: 4
TZST-0001FASTVT: 5
TZST-0001FASTVT: 6
TZST-0001SLOWVT: 2
TZST-0001SLOWVT: 3
TZST-0001SLOWVT: 4
TZST-0001SLOWVT: 5
TZST-0001SLOWVT: 6
TZST-0002ATACH: NEGATIVE
TZST-0002ATACH: NEGATIVE
TZST-0002ATACH: NEGATIVE
TZST-0002FASTVT: NEGATIVE
TZST-0002FASTVT: NEGATIVE
TZST-0002FASTVT: NEGATIVE
TZST-0002FASTVT: NEGATIVE
TZST-0002FASTVT: NEGATIVE
TZST-0002SLOWVT: NEGATIVE
TZST-0002SLOWVT: NEGATIVE
TZST-0002SLOWVT: NEGATIVE
TZST-0002SLOWVT: NEGATIVE
TZST-0002SLOWVT: NEGATIVE
VENTRICULAR PACING ICD: 0 pct
VF: 0

## 2013-02-15 ENCOUNTER — Encounter: Payer: Self-pay | Admitting: *Deleted

## 2013-03-27 ENCOUNTER — Encounter: Payer: Self-pay | Admitting: Internal Medicine

## 2013-04-30 ENCOUNTER — Encounter: Payer: BC Managed Care – PPO | Admitting: *Deleted

## 2013-05-04 ENCOUNTER — Encounter: Payer: Self-pay | Admitting: *Deleted

## 2013-10-30 ENCOUNTER — Encounter: Payer: Self-pay | Admitting: *Deleted

## 2013-12-06 ENCOUNTER — Telehealth: Payer: Self-pay | Admitting: Internal Medicine

## 2013-12-06 ENCOUNTER — Encounter: Payer: Self-pay | Admitting: Internal Medicine

## 2014-09-25 ENCOUNTER — Encounter (HOSPITAL_COMMUNITY): Payer: Self-pay | Admitting: Cardiology

## 2017-03-17 ENCOUNTER — Encounter: Payer: Self-pay | Admitting: Internal Medicine

## 2017-03-30 ENCOUNTER — Encounter: Payer: Self-pay | Admitting: Internal Medicine

## 2017-03-30 ENCOUNTER — Encounter: Payer: Self-pay | Admitting: *Deleted

## 2017-03-30 ENCOUNTER — Ambulatory Visit (INDEPENDENT_AMBULATORY_CARE_PROVIDER_SITE_OTHER): Payer: 59 | Admitting: Internal Medicine

## 2017-03-30 VITALS — BP 110/60 | HR 73 | Ht 72.0 in | Wt 219.0 lb

## 2017-03-30 DIAGNOSIS — Z95810 Presence of automatic (implantable) cardiac defibrillator: Secondary | ICD-10-CM | POA: Diagnosis not present

## 2017-03-30 DIAGNOSIS — I482 Chronic atrial fibrillation, unspecified: Secondary | ICD-10-CM

## 2017-03-30 DIAGNOSIS — Z01812 Encounter for preprocedural laboratory examination: Secondary | ICD-10-CM | POA: Diagnosis not present

## 2017-03-30 DIAGNOSIS — I428 Other cardiomyopathies: Secondary | ICD-10-CM

## 2017-03-30 LAB — CUP PACEART INCLINIC DEVICE CHECK
Brady Statistic AP VP Percent: 0 %
Brady Statistic AP VS Percent: 0 %
Brady Statistic AS VS Percent: 18.2 %
Brady Statistic RA Percent Paced: 0 %
Brady Statistic RV Percent Paced: 87.35 %
Date Time Interrogation Session: 20180613114926
HighPow Impedance: 266 Ohm
HighPow Impedance: 45 Ohm
HighPow Impedance: 54 Ohm
Implantable Lead Implant Date: 20121218
Implantable Lead Location: 753858
Implantable Lead Location: 753860
Implantable Lead Model: 6947
Lead Channel Impedance Value: 380 Ohm
Lead Channel Impedance Value: 4047 Ohm
Lead Channel Impedance Value: 437 Ohm
Lead Channel Impedance Value: 646 Ohm
Lead Channel Impedance Value: 950 Ohm
Lead Channel Pacing Threshold Pulse Width: 0.6 ms
Lead Channel Setting Pacing Amplitude: 1.75 V
Lead Channel Setting Pacing Amplitude: 2.5 V
Lead Channel Setting Pacing Pulse Width: 0.4 ms
MDC IDC LEAD IMPLANT DT: 20121218
MDC IDC MSMT BATTERY VOLTAGE: 2.63 V
MDC IDC MSMT LEADCHNL LV PACING THRESHOLD AMPLITUDE: 0.75 V
MDC IDC MSMT LEADCHNL LV PACING THRESHOLD PULSEWIDTH: 0.4 ms
MDC IDC MSMT LEADCHNL RV PACING THRESHOLD AMPLITUDE: 1.25 V
MDC IDC MSMT LEADCHNL RV PACING THRESHOLD AMPLITUDE: 1.5 V
MDC IDC MSMT LEADCHNL RV PACING THRESHOLD PULSEWIDTH: 0.4 ms
MDC IDC PG IMPLANT DT: 20121218
MDC IDC SET LEADCHNL RV PACING PULSEWIDTH: 0.6 ms
MDC IDC SET LEADCHNL RV SENSING SENSITIVITY: 0.3 mV
MDC IDC STAT BRADY AS VP PERCENT: 81.8 %

## 2017-03-30 NOTE — Progress Notes (Signed)
ELECTROPHYSIOLOGY CONSULT NOTE  Patient ID: [name], MRN: [id number], DOB/AGE: 1946-05-18 71 y.o. Admit date: (Not on file) Date of Consult: 03/30/2017  Primary Physician: Jarome Matin, MD Primary Cardiologist: Ottis Stain  Consultation requested for ICD generator replacement:   HPI [name] is a 71 y.o. male  Referred for ICD generator replacement. His device is almost reached ERI.   He has a history of nonischemic cardiomyopathy chronic congestive heart failure and poorly controlled atrial fibrillation for which she was scheduled to undergo AV junction ablation . Eventually he developed complete heart block on his own and he never needed AV ablation    Most recent echocardiogram 10/15.  The patient denies chest pain, shortness of breath, nocturnal dyspnea, orthopnea or peripheral edema.  There have been no palpitations, lightheadedness or syncope.   Past Medical History:  Diagnosis Date  . Arthritis   . Asthma   . Atrial fibrillation (HCC)    chronic  . Chronic anticoagulation    followed by Dr. Eloise Harman  . Chronic systolic heart failure (HCC)    Ejection fraction 25-30% catheter November 2012 with moderate mitral regurgitation  . Diabetes mellitus 10/05/11   "on the verge; don't take RX for it"  . HTN (hypertension)   . Mitral regurgitation    moderate per echo in April of 2011  . Myocardial infarction (HCC) ~ 2005  . Syncope   . Tachy-brady syndrome [facility]) Sept 2012   with NSVT on holter      Surgical History:  Past Surgical History:  Procedure Laterality Date  . BI-VENTRICULAR IMPLANTABLE CARDIOVERTER DEFIBRILLATOR Left 10/05/2011   Procedure: BI-VENTRICULAR IMPLANTABLE CARDIOVERTER DEFIBRILLATOR  (CRT-D);  Surgeon: Duke Salvia, MD;  Location: [facility] CATH LAB;  Service: Cardiovascular;  Laterality: Left;  . CARDIAC CATHETERIZATION    . CARDIAC DEFIBRILLATOR PLACEMENT  10/05/11  . INGUINAL HERNIA REPAIR  1990's   right  . INSERT / REPLACE / REMOVE  PACEMAKER  10/05/11   "first time"  . LEFT AND RIGHT HEART CATHETERIZATION WITH CORONARY ANGIOGRAM N/A 09/14/2011   Procedure: LEFT AND RIGHT HEART CATHETERIZATION WITH CORONARY ANGIOGRAM;  Surgeon: Pamella Pert, MD;  Location: [facility] CATH LAB;  Service: Cardiovascular;  Laterality: N/A;  . repair of right index finger  ~ 1990's  . surgery for an infected right sacroiliac joint  ~ 2006  . TONSILLECTOMY  ~ 1955  . US ECHOCARDIOGRAPHY  Sept 2012`   EF 30 to 35%     Home Meds: Prior to Admission medications   Medication Sig Start Date End Date Taking? Authorizing Provider  albuterol (PROVENTIL) (5 MG/ML) 0.5% nebulizer solution Take 2.5 mg by nebulization every 6 (six) hours as needed. For wheezing    Yes [provider]  BYSTOLIC 20 MG TABS take 1 tablet by mouth once daily 08/20/12  Yes Duke Salvia, MD  digoxin (LANOXIN) 0.25 MG tablet Take 0.25 mg by mouth daily.   Yes [provider]  diltiazem (CARDIZEM CD) 180 MG 24 hr capsule As directed 07/03/12  Yes [provider]  fish oil-omega-3 fatty acids 1000 MG capsule Take 1,200 mg by mouth 2 (two) times daily.    Yes [provider]  Fluticasone-Salmeterol (ADVAIR DISKUS) 250-50 MCG/DOSE AEPB Inhale 1 puff into the lungs every 12 (twelve) hours.     Yes [provider]  furosemide (LASIX) 40 MG tablet Take 40 mg by mouth daily.     Yes [provider]  metoCLOPramide (REGLAN) 5 MG tablet Take 5  mg by mouth daily.     Yes [provider]  olmesartan (BENICAR) 40 MG tablet Take 40 mg by mouth as directed. TAKES MON, WED, FRI AND SUN   Yes [provider]  Pseudoephedrine-Guaifenesin (MUCINEX D PO) Take 1 tablet by mouth daily as needed. For congestion    Yes [provider]  roflumilast (DALIRESP) 500 MCG TABS tablet Take 500 mcg by mouth daily.     Yes [provider]  warfarin (COUMADIN) 7.5 MG tablet Take 7.5 mg by mouth as directed. 7.5 MG MON,  TUES, THURS, SAT AND SUN...10MG  WED AND FRI    Yes [provider]      Allergies:  Allergies  Allergen Reactions  . Nitroglycerin Other (See Comments)    syncope  . Ace Inhibitors Cough  . Beta Adrenergic Blockers Other (See Comments)    Asthma w/beta blockers    Social History   Social History  . Marital status: Married    Spouse name: N/A  . Number of children: N/A  . Years of education: N/A   Occupational History  . Not on file.   Social History Main Topics  . Smoking status: Never Smoker  . Smokeless tobacco: Never Used  . Alcohol use Yes     Comment: 10/05/11 "once in awhile I'll have a beer"  . Drug use: No  . Sexual activity: Yes   Other Topics Concern  . Not on file   Social History Narrative  . No narrative on file     Family History  Problem Relation Age of Onset  . Heart disease Mother   . Heart disease Father      ROS:  Please see the history of present illness.     All other systems reviewed and negative.    Physical Exam:  Blood pressure 110/60, pulse 73, height 6' (1.829 m), weight 219 lb (99.3 kg), SpO2 96 %. General: Well developed, well nourished male in no acute distress. Head: Normocephalic, atraumatic, sclera non-icteric, no xanthomas, nares are without discharge. EENT: normal Lymph Nodes:  none Back: without scoliosis/kyphosis , no CVA tendersness Neck: Negative for carotid bruits. JVD 6-7 Lungs: Clear bilaterally to auscultation without wheezes, rales, or rhonchi. Breathing is unlabored. Heart: RRR with S1 S2. 2/6 systolic  murmur , rubs, or gallops appreciated. Abdomen: Soft, non-tender, non-distended with normoactive bowel sounds. No hepatomegaly. No rebound/guarding. No obvious abdominal masses. Msk:  Strength and tone appear normal for age. Extremities: No clubbing or cyanosis. No edema.  Distal pedal pulses are 2+ and equal bilaterally. Skin: Warm and Dry Neuro: Alert and oriented X 3. CN III-XII intact Grossly  normal sensory and motor function . Psych:  Responds to questions appropriately with a normal affect.      Labs: Cardiac Enzymes No results for input(s): CKTOTAL, CKMB, TROPONINI in the last 72 hours. CBC Lab Results  Component Value Date   WBC 6.3 09/30/2011   HGB 12.8 (L) 09/30/2011   HCT 37.8 (L) 09/30/2011   MCV 91.1 09/30/2011   PLT 169.0 09/30/2011   PROTIME: No results for input(s): LABPROT, INR in the last 72 hours. Chemistry No results for input(s): NA, K, CL, CO2, BUN, CREATININE, CALCIUM, PROT, BILITOT, ALKPHOS, ALT, AST, GLUCOSE in the last 168 hours.  Invalid input(s): LABALBU Lipids No results found for: CHOL, HDL, LDLCALC, TRIG BNP Pro B Natriuretic peptide (BNP)  Date/Time Value Ref Range Status  01/24/2010 06:26 AM 159.0 (H) 0.0 - 100.0 pg/mL Final  01/23/2010 04:45  AM 454.0 (H) 0.0 - 100.0 pg/mL Final  01/22/2010 08:28 PM 537.0 (H) 0.0 - 100.0 pg/mL Final  08/07/2008 04:30 AM 396.0 (H)  Final   Thyroid Function Tests: No results for input(s): TSH, T4TOTAL, T3FREE, THYROIDAB in the last 72 hours.  Invalid input(s): FREET3    Miscellaneous No results found for: DDIMER  Radiology/Studies:  No results found.  EKG:  Atrial fibrillation with complete heart block and ventricular pacing and frequent multifocal PVCs   Assessment and Plan:   Atrial fibrillation-permanent  Biventricular defibrillator  Complete heart block   Nonischemic cardiomyopathy with interval resolution (most recently 2015   The patient's device is almost reached ERI. We have discussed generator replacement CRT P versus CRT-D given presumed persistent and stable LV function recovery (echo is pending). In the event that his LV function remains normal, we will discontinue digoxin. I discussed this with Dr. Rona Ravens also plan to think about discontinuing his diltiazem as he is in complete heart block no longer needs it for rate control. He does not appear to need it for blood  pressure control  We have reviewed the benefits and risks of generator replacement.  These include but are not limited to lead fracture and infection.  The patient understands, agrees and is willing to proceed.        Sherryl Manges

## 2017-03-30 NOTE — Patient Instructions (Addendum)
Medication Instructions: - Your physician recommends that you continue on your current medications as directed. Please refer to the Current Medication list given to you today.  Labwork: - Your physician recommends that you return for lab work Friday 04/29/17 CBC, BMET, and PT/INR   Procedures/Testing: - Your physician has recommended that you have a defibrillator generator/ battery change.  Follow-Up: - Your physician recommends that you schedule a follow-up appointment in: 10-14 days (from 05/13/17) with the Device Clinic for a wound check appointment.   Any Additional Special Instructions Will Be Listed Below (If Applicable).     If you need a refill on your cardiac medications before your next appointment, please call your pharmacy.  87.4

## 2017-04-29 ENCOUNTER — Other Ambulatory Visit: Payer: 59

## 2017-05-02 ENCOUNTER — Telehealth: Payer: Self-pay | Admitting: Internal Medicine

## 2017-05-02 ENCOUNTER — Other Ambulatory Visit: Payer: 59

## 2017-05-02 ENCOUNTER — Other Ambulatory Visit: Payer: 59 | Admitting: *Deleted

## 2017-05-02 DIAGNOSIS — Z01812 Encounter for preprocedural laboratory examination: Secondary | ICD-10-CM

## 2017-05-02 NOTE — Telephone Encounter (Signed)
Lm to call back ./cy 

## 2017-05-02 NOTE — Telephone Encounter (Signed)
Pt aware may have labs done today./cy

## 2017-05-02 NOTE — Telephone Encounter (Signed)
New message    Pt is calling to rs his labs. He said he was not aware our office closed early on Friday. He wants to make sure having his labs today and not Friday will not interfere with his procedure that's scheduled on the 27th.

## 2017-05-03 LAB — BASIC METABOLIC PANEL
BUN/Creatinine Ratio: 13 (ref 10–24)
BUN: 19 mg/dL (ref 8–27)
CALCIUM: 9.3 mg/dL (ref 8.6–10.2)
CO2: 18 mmol/L — AB (ref 20–29)
Chloride: 104 mmol/L (ref 96–106)
Creatinine, Ser: 1.5 mg/dL — ABNORMAL HIGH (ref 0.76–1.27)
GFR calc non Af Amer: 47 mL/min/{1.73_m2} — ABNORMAL LOW (ref >59)
GFR, EST AFRICAN AMERICAN: 54 mL/min/{1.73_m2} — AB (ref >59)
Glucose: 86 mg/dL (ref 65–99)
POTASSIUM: 4.6 mmol/L (ref 3.5–5.2)
Sodium: 140 mmol/L (ref 134–144)

## 2017-05-03 LAB — CBC WITH DIFFERENTIAL/PLATELET
BASOS ABS: 0 10*3/uL (ref 0.0–0.2)
Basos: 1 %
EOS (ABSOLUTE): 0.1 10*3/uL (ref 0.0–0.4)
Eos: 2 %
Hematocrit: 37.4 % — ABNORMAL LOW (ref 37.5–51.0)
Hemoglobin: 12.7 g/dL — ABNORMAL LOW (ref 13.0–17.7)
IMMATURE GRANS (ABS): 0.1 10*3/uL (ref 0.0–0.1)
Immature Granulocytes: 1 %
LYMPHS: 21 %
Lymphocytes Absolute: 1.7 10*3/uL (ref 0.7–3.1)
MCH: 30.4 pg (ref 26.6–33.0)
MCHC: 34 g/dL (ref 31.5–35.7)
MCV: 90 fL (ref 79–97)
Monocytes Absolute: 0.6 10*3/uL (ref 0.1–0.9)
Monocytes: 8 %
NEUTROS ABS: 5.6 10*3/uL (ref 1.4–7.0)
NEUTROS PCT: 67 %
PLATELETS: 178 10*3/uL (ref 150–379)
RBC: 4.18 x10E6/uL (ref 4.14–5.80)
RDW: 14.6 % (ref 12.3–15.4)
WBC: 8.1 10*3/uL (ref 3.4–10.8)

## 2017-05-03 LAB — PROTIME-INR
INR: 2.3 — AB (ref 0.8–1.2)
PROTHROMBIN TIME: 23.1 s — AB (ref 9.1–12.0)

## 2017-05-13 ENCOUNTER — Encounter (HOSPITAL_COMMUNITY)
Admission: RE | Disposition: A | Discharge status: Home / Self Care | Payer: Self-pay | Source: Ambulatory Visit | Attending: Internal Medicine

## 2017-05-13 ENCOUNTER — Ambulatory Visit (HOSPITAL_COMMUNITY)
Admission: RE | Admit: 2017-05-13 | Discharge: 2017-05-13 | Disposition: A | Discharge status: Home / Self Care | Payer: 59 | Source: Ambulatory Visit | Attending: Internal Medicine | Admitting: Internal Medicine

## 2017-05-13 DIAGNOSIS — I110 Hypertensive heart disease with heart failure: Secondary | ICD-10-CM | POA: Diagnosis not present

## 2017-05-13 DIAGNOSIS — I428 Other cardiomyopathies: Secondary | ICD-10-CM | POA: Insufficient documentation

## 2017-05-13 DIAGNOSIS — J45909 Unspecified asthma, uncomplicated: Secondary | ICD-10-CM | POA: Insufficient documentation

## 2017-05-13 DIAGNOSIS — M1990 Unspecified osteoarthritis, unspecified site: Secondary | ICD-10-CM | POA: Diagnosis not present

## 2017-05-13 DIAGNOSIS — Z7901 Long term (current) use of anticoagulants: Secondary | ICD-10-CM | POA: Insufficient documentation

## 2017-05-13 DIAGNOSIS — E119 Type 2 diabetes mellitus without complications: Secondary | ICD-10-CM | POA: Insufficient documentation

## 2017-05-13 DIAGNOSIS — I252 Old myocardial infarction: Secondary | ICD-10-CM | POA: Insufficient documentation

## 2017-05-13 DIAGNOSIS — Z7951 Long term (current) use of inhaled steroids: Secondary | ICD-10-CM | POA: Diagnosis not present

## 2017-05-13 DIAGNOSIS — Z8249 Family history of ischemic heart disease and other diseases of the circulatory system: Secondary | ICD-10-CM | POA: Diagnosis not present

## 2017-05-13 DIAGNOSIS — I340 Nonrheumatic mitral (valve) insufficiency: Secondary | ICD-10-CM | POA: Diagnosis not present

## 2017-05-13 DIAGNOSIS — I442 Atrioventricular block, complete: Secondary | ICD-10-CM | POA: Insufficient documentation

## 2017-05-13 DIAGNOSIS — Z95810 Presence of automatic (implantable) cardiac defibrillator: Secondary | ICD-10-CM | POA: Diagnosis present

## 2017-05-13 DIAGNOSIS — I5022 Chronic systolic (congestive) heart failure: Secondary | ICD-10-CM | POA: Diagnosis not present

## 2017-05-13 DIAGNOSIS — I482 Chronic atrial fibrillation: Secondary | ICD-10-CM | POA: Diagnosis not present

## 2017-05-13 DIAGNOSIS — Z4502 Encounter for adjustment and management of automatic implantable cardiac defibrillator: Secondary | ICD-10-CM | POA: Insufficient documentation

## 2017-05-13 HISTORY — PX: ICD GENERATOR CHANGEOUT: EP1231

## 2017-05-13 LAB — SURGICAL PCR SCREEN
MRSA, PCR: NEGATIVE
Staphylococcus aureus: NEGATIVE

## 2017-05-13 LAB — GLUCOSE, CAPILLARY: GLUCOSE-CAPILLARY: 129 mg/dL — AB (ref 65–99)

## 2017-05-13 LAB — PROTIME-INR
INR: 1.71
Prothrombin Time: 20.3 seconds — ABNORMAL HIGH (ref 11.4–15.2)

## 2017-05-13 SURGERY — ICD GENERATOR CHANGEOUT

## 2017-05-13 MED ORDER — MUPIROCIN 2 % EX OINT
TOPICAL_OINTMENT | CUTANEOUS | Status: AC
  Administered 2017-05-13: 1 via TOPICAL
  Filled 2017-05-13: qty 22

## 2017-05-13 MED ORDER — CEFAZOLIN SODIUM-DEXTROSE 2-4 GM/100ML-% IV SOLN
2.0000 g | INTRAVENOUS | Status: AC
  Administered 2017-05-13: 2 g via INTRAVENOUS

## 2017-05-13 MED ORDER — MUPIROCIN 2 % EX OINT
1.0000 "application " | TOPICAL_OINTMENT | Freq: Once | CUTANEOUS | Status: AC
  Administered 2017-05-13: 1 via TOPICAL
  Filled 2017-05-13: qty 22

## 2017-05-13 MED ORDER — FENTANYL CITRATE (PF) 100 MCG/2ML IJ SOLN
INTRAMUSCULAR | Status: AC
  Filled 2017-05-13: qty 2

## 2017-05-13 MED ORDER — SODIUM CHLORIDE 0.9 % IR SOLN
80.0000 mg | Status: AC
  Administered 2017-05-13: 80 mg

## 2017-05-13 MED ORDER — MIDAZOLAM HCL 5 MG/5ML IJ SOLN
INTRAMUSCULAR | Status: AC
  Filled 2017-05-13: qty 5

## 2017-05-13 MED ORDER — ACETAMINOPHEN 325 MG PO TABS: 325.0000 mg | ORAL_TABLET | ORAL | Status: DC | PRN

## 2017-05-13 MED ORDER — ONDANSETRON HCL 4 MG/2ML IJ SOLN: 4.0000 mg | Freq: Four times a day (QID) | INTRAMUSCULAR | Status: DC | PRN

## 2017-05-13 MED ORDER — SODIUM CHLORIDE 0.9 % IR SOLN
Status: AC
  Filled 2017-05-13: qty 2

## 2017-05-13 MED ORDER — MIDAZOLAM HCL 5 MG/5ML IJ SOLN
INTRAMUSCULAR | Status: DC | PRN
  Administered 2017-05-13: 2 mg via INTRAVENOUS
  Administered 2017-05-13 (×2): 1 mg via INTRAVENOUS

## 2017-05-13 MED ORDER — SODIUM CHLORIDE 0.9 % IV SOLN: INTRAVENOUS | Status: AC

## 2017-05-13 MED ORDER — LIDOCAINE HCL (PF) 1 % IJ SOLN
INTRAMUSCULAR | Status: AC
  Filled 2017-05-13: qty 30

## 2017-05-13 MED ORDER — LIDOCAINE HCL (PF) 1 % IJ SOLN
INTRAMUSCULAR | Status: DC | PRN
  Administered 2017-05-13: 30 mL

## 2017-05-13 MED ORDER — FENTANYL CITRATE (PF) 100 MCG/2ML IJ SOLN
INTRAMUSCULAR | Status: DC | PRN
  Administered 2017-05-13 (×3): 25 ug via INTRAVENOUS

## 2017-05-13 MED ORDER — CEFAZOLIN SODIUM-DEXTROSE 2-4 GM/100ML-% IV SOLN
INTRAVENOUS | Status: AC
  Filled 2017-05-13: qty 100

## 2017-05-13 MED ORDER — SODIUM CHLORIDE 0.9 % IV SOLN
INTRAVENOUS | Status: DC
  Administered 2017-05-13: 10:00:00 via INTRAVENOUS

## 2017-05-13 SURGICAL SUPPLY — 5 items
CABLE SURGICAL S-101-97-12 (CABLE) ×2 IMPLANT
HEMOSTAT SURGICEL 2X4 FIBR (HEMOSTASIS) ×2 IMPLANT
ICD CLARIA MRI DTMA1D1 (ICD Generator) ×2 IMPLANT
PAD DEFIB LIFELINK (PAD) ×2 IMPLANT
TRAY PACEMAKER INSERTION (PACKS) ×2 IMPLANT

## 2017-05-13 NOTE — Discharge Instructions (Signed)

## 2017-05-13 NOTE — Progress Notes (Signed)
ICD Criteria  Current LVEF:45%. Within 12 months prior to implant: Yes   Heart failure history: Yes, Class II  Cardiomyopathy history: Yes, Non-Ischemic Cardiomyopathy.  Atrial Fibrillation/Atrial Flutter: Yes, Permanent.  Ventricular tachycardia history: No.  Cardiac arrest history: No.  History of syndromes with risk of sudden death: No.  Previous ICD: Yes, Reason for ICD:  Primary prevention.  Current ICD indication: Primary  PPM indication: Yes. Pacing type: Ventricular. Greater than 40% RV pacing requirement anticipated. Indication: Complete Heart Block   Class I or II Bradycardia indication present: Yes  Beta Blocker therapy for 3 or more months: Yes, prescribed.   Ace Inhibitor/ARB therapy for 3 or more months: Yes, prescribed.

## 2017-05-13 NOTE — H&P (Signed)
Patient Care Team: Jarome Matin, MD as PCP - General (Internal Medicine)   HPI  [name] is a 71 y.o. male here for generator replacement.   He has a history of nonischemic cardiomyopathy chronic congestive heart failure and poorly controlled atrial fibrillation for which she was scheduled to undergo AV junction ablation . Eventually he developed complete heart block on his own and he never needed AV ablation    Echo EF 45-50% without significant change from 2014  Records and Results Reviewed As above  No interval change in functional status, without chest pain,edema , but chrionic mil SOB  He and his wife decided to  replace High voltage CRT with HV device   Past Medical History:  Diagnosis Date  . Arthritis   . Asthma   . Atrial fibrillation (HCC)    chronic  . Chronic anticoagulation    followed by Dr. Eloise Harman  . Chronic systolic heart failure (HCC)    Ejection fraction 25-30% catheter November 2012 with moderate mitral regurgitation  . Diabetes mellitus 10/05/11   "on the verge; don't take RX for it"  . HTN (hypertension)   . Mitral regurgitation    moderate per echo in April of 2011  . Myocardial infarction (HCC) ~ 2005  . Syncope   . Tachy-brady syndrome [facility]) Sept 2012   with NSVT on holter    Past Surgical History:  Procedure Laterality Date  . BI-VENTRICULAR IMPLANTABLE CARDIOVERTER DEFIBRILLATOR Left 10/05/2011   Procedure: BI-VENTRICULAR IMPLANTABLE CARDIOVERTER DEFIBRILLATOR  (CRT-D);  Surgeon: Duke Salvia, MD;  Location: [facility] CATH LAB;  Service: Cardiovascular;  Laterality: Left;  . CARDIAC CATHETERIZATION    . CARDIAC DEFIBRILLATOR PLACEMENT  10/05/11  . INGUINAL HERNIA REPAIR  1990's   right  . INSERT / REPLACE / REMOVE PACEMAKER  10/05/11   "first time"  . LEFT AND RIGHT HEART CATHETERIZATION WITH CORONARY ANGIOGRAM N/A 09/14/2011   Procedure: LEFT AND RIGHT HEART CATHETERIZATION WITH CORONARY ANGIOGRAM;  Surgeon: Pamella Pert, MD;  Location: [facility] CATH LAB;  Service: Cardiovascular;  Laterality: N/A;  . repair of right index finger  ~ 1990's  . surgery for an infected right sacroiliac joint  ~ 2006  . TONSILLECTOMY  ~ 1955  . US ECHOCARDIOGRAPHY  Sept 2012`   EF 30 to 35%    Current Facility-Administered Medications  Medication Dose Route Frequency Provider Last Rate Last Dose  . 0.9 %  sodium chloride infusion   Intravenous Continuous Duke Salvia, MD      . ceFAZolin (ANCEF) IVPB 2g/100 mL premix  2 g Intravenous On Call Duke Salvia, MD      . gentamicin (GARAMYCIN) 80 mg in sodium chloride irrigation 0.9 % 500 mL irrigation  80 mg Irrigation On Call Duke Salvia, MD        Allergies  Allergen Reactions  . Nitroglycerin Other (See Comments)    syncope  . Ace Inhibitors Cough  . Beta Adrenergic Blockers Other (See Comments)    Asthma w/beta blockers      Social History  Substance Use Topics  . Smoking status: Never Smoker  . Smokeless tobacco: Never Used  . Alcohol use Yes     Comment: 10/05/11 "once in awhile I'll have a beer"     Family History  Problem Relation Age of Onset  . Heart disease Mother   . Heart disease Father      No current facility-administered medications on file prior to  encounter.    Current Outpatient Prescriptions on File Prior to Encounter  Medication Sig Dispense Refill  . digoxin (LANOXIN) 0.25 MG tablet Take 0.25 mg by mouth daily.    Marland Kitchen diltiazem (CARDIZEM CD) 180 MG 24 hr capsule Take 180 mg by mouth daily.     . fish oil-omega-3 fatty acids 1000 MG capsule Take 1,000 mg by mouth 2 (two) times daily.     . Fluticasone-Salmeterol (ADVAIR DISKUS) 250-50 MCG/DOSE AEPB Inhale 1 puff into the lungs every 12 (twelve) hours.      . metoCLOPramide (REGLAN) 5 MG tablet Take 5 mg by mouth daily.      Marland Kitchen olmesartan (BENICAR) 40 MG tablet Take 40 mg by mouth daily.     . roflumilast (DALIRESP) 500 MCG TABS tablet Take 500 mcg by mouth daily.      Marland Kitchen albuterol  (PROVENTIL) (5 MG/ML) 0.5% nebulizer solution Take 2.5 mg by nebulization every 6 (six) hours as needed. For wheezing     . furosemide (LASIX) 40 MG tablet Take 40 mg by mouth daily.          Review of Systems negative except from HPI and PMH  Physical Exam BP (!) 149/93   Pulse 81   Temp 97.6 F (36.4 C)   Ht 6\' 1"  (1.854 m)   Wt 215 lb (97.5 kg)   SpO2 99%   BMI 28.37 kg/m  Well developed and well nourished in no acute distress HENT normal E scleral and icterus clear Neck Supple JVP flat; carotids brisk and full Clear to ausculation Device pocket well healed; without hematoma or erythema.  There is no tethering Regular rate and rhythm, no murmurs gallops or rub Soft with active bowel sounds No clubbing cyanosis  Edema Alert and oriented, grossly normal motor and sensory function Skin Warm and Dry    Assessment and  Plan  Atrial fibrillation-permanent  Biventricular defibrillator  Complete heart block   Nonischemic cardiomyopathy with interval  Partial resolution      Device at ERI  We have reviewed the benefits and risks of generator replacement.  These include but are not limited to lead fracture and infection.  The patient understands, agrees and is willing to proceed.    They would like to proceed with HV device replacement

## 2017-05-16 ENCOUNTER — Encounter (HOSPITAL_COMMUNITY): Payer: Self-pay | Admitting: Internal Medicine

## 2017-05-16 MED FILL — Lidocaine HCl Local Preservative Free (PF) Inj 1%: INTRAMUSCULAR | Qty: 30 | Status: AC

## 2017-05-17 ENCOUNTER — Encounter (HOSPITAL_COMMUNITY): Payer: Self-pay | Admitting: Cardiology

## 2017-05-23 ENCOUNTER — Ambulatory Visit (INDEPENDENT_AMBULATORY_CARE_PROVIDER_SITE_OTHER): Payer: 59 | Admitting: *Deleted

## 2017-05-23 DIAGNOSIS — I428 Other cardiomyopathies: Secondary | ICD-10-CM

## 2017-05-23 DIAGNOSIS — Z95810 Presence of automatic (implantable) cardiac defibrillator: Secondary | ICD-10-CM

## 2017-05-23 DIAGNOSIS — I482 Chronic atrial fibrillation, unspecified: Secondary | ICD-10-CM

## 2017-05-23 LAB — CUP PACEART INCLINIC DEVICE CHECK
Brady Statistic AP VS Percent: 0 %
Brady Statistic AS VP Percent: 0 %
Brady Statistic AS VS Percent: 0 %
Brady Statistic RA Percent Paced: 0 %
HIGH POWER IMPEDANCE MEASURED VALUE: 57 Ohm
Implantable Lead Implant Date: 20121218
Implantable Lead Location: 753858
Implantable Lead Model: 6947
Lead Channel Impedance Value: 285 Ohm
Lead Channel Impedance Value: 399 Ohm
Lead Channel Impedance Value: 589 Ohm
Lead Channel Pacing Threshold Amplitude: 2 V
Lead Channel Pacing Threshold Pulse Width: 0.4 ms
Lead Channel Sensing Intrinsic Amplitude: 6.625 mV
Lead Channel Setting Pacing Amplitude: 3 V
Lead Channel Setting Pacing Pulse Width: 0.4 ms
Lead Channel Setting Sensing Sensitivity: 0.3 mV
MDC IDC LEAD IMPLANT DT: 20121218
MDC IDC LEAD LOCATION: 753860
MDC IDC MSMT BATTERY REMAINING LONGEVITY: 82 mo
MDC IDC MSMT BATTERY VOLTAGE: 3.1 V
MDC IDC MSMT LEADCHNL LV IMPEDANCE VALUE: 874 Ohm
MDC IDC MSMT LEADCHNL LV PACING THRESHOLD PULSEWIDTH: 0.4 ms
MDC IDC MSMT LEADCHNL RA IMPEDANCE VALUE: 4047 Ohm
MDC IDC MSMT LEADCHNL RV IMPEDANCE VALUE: 361 Ohm
MDC IDC MSMT LEADCHNL RV PACING THRESHOLD AMPLITUDE: 1.25 V
MDC IDC MSMT LEADCHNL RV SENSING INTR AMPL: 6.125 mV
MDC IDC PG IMPLANT DT: 20180727
MDC IDC SESS DTM: 20180806154812
MDC IDC SET LEADCHNL LV PACING AMPLITUDE: 3 V
MDC IDC SET LEADCHNL LV PACING PULSEWIDTH: 0.4 ms
MDC IDC STAT BRADY AP VP PERCENT: 0 %
MDC IDC STAT BRADY RV PERCENT PACED: 87.77 %

## 2017-05-23 NOTE — Progress Notes (Signed)
Wound check appointment s/p CRTD generator replacement by Dr. Graciela Husbands. Dermabond removed. Wound without redness or edema. Incision edges approximated, wound well healed. Normal device function. Thresholds, sensing, and impedances consistent with implant measurements. Histogram distribution appropriate for patient and level of activity. No ventricular arrhythmias noted. Patient educated about wound care, arm mobility, lifting restrictions. [name] encouraged to find Dr. Verl Dicker shock plan and follow up about [facility] monitoring through his device staff. He verbalizes understanding. Follow-up with Dr. Jacinto Halim after today.

## 2017-08-11 ENCOUNTER — Emergency Department (HOSPITAL_BASED_OUTPATIENT_CLINIC_OR_DEPARTMENT_OTHER)
Admission: EM | Admit: 2017-08-11 | Discharge: 2017-08-11 | Disposition: A | Discharge status: Home / Self Care | Payer: Worker's Compensation | Attending: Emergency Medicine | Admitting: Emergency Medicine

## 2017-08-11 ENCOUNTER — Encounter (HOSPITAL_BASED_OUTPATIENT_CLINIC_OR_DEPARTMENT_OTHER): Payer: Self-pay

## 2017-08-11 DIAGNOSIS — Y929 Unspecified place or not applicable: Secondary | ICD-10-CM | POA: Diagnosis not present

## 2017-08-11 DIAGNOSIS — Z7901 Long term (current) use of anticoagulants: Secondary | ICD-10-CM | POA: Diagnosis not present

## 2017-08-11 DIAGNOSIS — Z23 Encounter for immunization: Secondary | ICD-10-CM | POA: Insufficient documentation

## 2017-08-11 DIAGNOSIS — I110 Hypertensive heart disease with heart failure: Secondary | ICD-10-CM | POA: Diagnosis not present

## 2017-08-11 DIAGNOSIS — S6982XA Other specified injuries of left wrist, hand and finger(s), initial encounter: Secondary | ICD-10-CM | POA: Diagnosis present

## 2017-08-11 DIAGNOSIS — Y9389 Activity, other specified: Secondary | ICD-10-CM | POA: Diagnosis not present

## 2017-08-11 DIAGNOSIS — I5022 Chronic systolic (congestive) heart failure: Secondary | ICD-10-CM | POA: Diagnosis not present

## 2017-08-11 DIAGNOSIS — Z79899 Other long term (current) drug therapy: Secondary | ICD-10-CM | POA: Diagnosis not present

## 2017-08-11 DIAGNOSIS — E119 Type 2 diabetes mellitus without complications: Secondary | ICD-10-CM | POA: Diagnosis not present

## 2017-08-11 DIAGNOSIS — S61211A Laceration without foreign body of left index finger without damage to nail, initial encounter: Secondary | ICD-10-CM | POA: Insufficient documentation

## 2017-08-11 DIAGNOSIS — J45909 Unspecified asthma, uncomplicated: Secondary | ICD-10-CM | POA: Insufficient documentation

## 2017-08-11 DIAGNOSIS — Y990 Civilian activity done for income or pay: Secondary | ICD-10-CM | POA: Diagnosis not present

## 2017-08-11 DIAGNOSIS — W260XXA Contact with knife, initial encounter: Secondary | ICD-10-CM | POA: Diagnosis not present

## 2017-08-11 DIAGNOSIS — I252 Old myocardial infarction: Secondary | ICD-10-CM | POA: Diagnosis not present

## 2017-08-11 DIAGNOSIS — Z7984 Long term (current) use of oral hypoglycemic drugs: Secondary | ICD-10-CM | POA: Diagnosis not present

## 2017-08-11 MED ORDER — LIDOCAINE HCL 2 % IJ SOLN
10.0000 mL | Freq: Once | INTRAMUSCULAR | Status: AC
  Administered 2017-08-11: 200 mg
  Filled 2017-08-11: qty 20

## 2017-08-11 MED ORDER — TETANUS-DIPHTH-ACELL PERTUSSIS 5-2.5-18.5 LF-MCG/0.5 IM SUSP
0.5000 mL | Freq: Once | INTRAMUSCULAR | Status: AC
  Administered 2017-08-11: 0.5 mL via INTRAMUSCULAR
  Filled 2017-08-11: qty 0.5

## 2017-08-11 NOTE — Discharge Instructions (Signed)
Your stitches need to be removed in 7 days.  Please continue to wear the splint until you are stitches come out so that you do not risk ripping them out with moving your finger. Any work you can perform while you are wearing you splint is okay to do.   Take the bandage off daily and clean the finger with warm soap and water. Then, dry the finger, apply a topical antibiotic like bacitracin and put a new gauze bandage over the finger before applying the splint.   Tylenol and ibuprofen can be taken as directed on the bottle for pain control.   If you develop a fever, chills, or if the finger becomes red, not or swollen, please return to the Emergency Department for a wound check.

## 2017-08-11 NOTE — ED Notes (Signed)
ED Provider at bedside suturing.  

## 2017-08-11 NOTE — ED Provider Notes (Signed)
[facility] EMERGENCY DEPARTMENT Provider Note   CSN: [id number] Arrival date & time: 08/11/17  1155     History   Chief Complaint Chief Complaint  Patient presents with  . Finger Injury    HPI [name] is a 71 y.o. male with a h/o of DM who presents to the Emergency Department with a chief complaint of laceration to the left index finger while using a box cutter at work at 11:15 AM. Bleeding is controlled and a bandage is in place. He denies numbness or weakness to the digit. No previous injuries or surgeries.  He is unsure of when his last tetanus immunization occurred. He reports his blood sugar is well controlled with his DM, and he checks his sugar daily.   The history is provided by the patient. No language interpreter was used.    Past Medical History:  Diagnosis Date  . Arthritis   . Asthma   . Atrial fibrillation (HCC)    chronic  . Chronic anticoagulation    followed by Dr. Eloise Harman  . Chronic systolic heart failure (HCC)    Ejection fraction 25-30% catheter November 2012 with moderate mitral regurgitation  . Diabetes mellitus 10/05/11   "on the verge; don't take RX for it"  . HTN (hypertension)   . Mitral regurgitation    moderate per echo in April of 2011  . Myocardial infarction (HCC) ~ 2005  . Syncope   . Tachy-brady syndrome [facility]) Sept 2012   with NSVT on holter    Patient Active Problem List   Diagnosis Date Noted  . Congestive heart failure (HCC) 10/25/2012  . Biventricular implantable cardioverter-defibrillator in situ 12/21/2011  . NICM (nonischemic cardiomyopathy) (HCC) 09/24/2011  . Syncope and collapse 09/14/2011  . Atrial fibrillation (HCC)   . Diabetes mellitus   . Mitral insufficiency   . Asthma   . Chronic anticoagulation   . HTN (hypertension)     Past Surgical History:  Procedure Laterality Date  . BI-VENTRICULAR IMPLANTABLE CARDIOVERTER DEFIBRILLATOR Left 10/05/2011   Procedure: BI-VENTRICULAR IMPLANTABLE  CARDIOVERTER DEFIBRILLATOR  (CRT-D);  Surgeon: Duke Salvia, MD;  Location: [facility] CATH LAB;  Service: Cardiovascular;  Laterality: Left;  . CARDIAC CATHETERIZATION    . CARDIAC DEFIBRILLATOR PLACEMENT  10/05/11  . ICD GENERATOR CHANGEOUT N/A 05/13/2017   Procedure: ICD Generator Changeout;  Surgeon: Duke Salvia, MD;  Location: [facility] INVASIVE CV LAB;  Service: Cardiovascular;  Laterality: N/A;  . INGUINAL HERNIA REPAIR  1990's   right  . INSERT / REPLACE / REMOVE PACEMAKER  10/05/11   "first time"  . LEFT AND RIGHT HEART CATHETERIZATION WITH CORONARY ANGIOGRAM N/A 09/14/2011   Procedure: LEFT AND RIGHT HEART CATHETERIZATION WITH CORONARY ANGIOGRAM;  Surgeon: Pamella Pert, MD;  Location: [facility] CATH LAB;  Service: Cardiovascular;  Laterality: N/A;  . repair of right index finger  ~ 1990's  . surgery for an infected right sacroiliac joint  ~ 2006  . TONSILLECTOMY  ~ 1955  . US ECHOCARDIOGRAPHY  Sept 2012`   EF 30 to 35%       Home Medications    Prior to Admission medications   Medication Sig Start Date End Date Taking? Authorizing Provider  acetaminophen (TYLENOL) 500 MG tablet Take 1,000 mg by mouth at bedtime.    [provider]  albuterol (PROVENTIL HFA;VENTOLIN HFA) 108 (90 Base) MCG/ACT inhaler Inhale 2 puffs into the lungs every 6 (six) hours as needed for wheezing or shortness of breath.    [provider]  albuterol (PROVENTIL) (5 MG/ML) 0.5% nebulizer solution Take 2.5 mg by nebulization every 6 (six) hours as needed. For wheezing     [provider]  BYSTOLIC 10 MG tablet Take 20 mg by mouth every evening.  05/02/17   [provider]  digoxin (LANOXIN) 0.25 MG tablet Take 0.25 mg by mouth daily.    [provider]  diltiazem (CARDIZEM CD) 180 MG 24 hr capsule Take 180 mg by mouth daily.  07/03/12   [provider]  fish oil-omega-3 fatty acids 1000 MG capsule Take 1,000 mg by mouth 2 (two) times daily.     [provider]  Fluticasone-Salmeterol (ADVAIR DISKUS) 250-50 MCG/DOSE AEPB Inhale 1 puff into the lungs every 12 (twelve) hours.      [provider]  furosemide (LASIX) 40 MG tablet Take 40 mg by mouth daily.      [provider]  glimepiride (AMARYL) 2 MG tablet Take 2 mg by mouth daily. 04/23/17   [provider]  guaiFENesin (MUCINEX) 600 MG 12 hr tablet Take 600 mg by mouth 2 (two) times daily as needed (for congestion/cough).    [provider]  metFORMIN (GLUCOPHAGE-XR) 500 MG 24 hr tablet Take 1,000 mg by mouth 2 (two) times daily. 05/02/17   [provider]  metoCLOPramide (REGLAN) 5 MG tablet Take 5 mg by mouth daily.      [provider]  olmesartan (BENICAR) 40 MG tablet Take 40 mg by mouth daily.     [provider]  roflumilast (DALIRESP) 500 MCG TABS tablet Take 500 mcg by mouth daily.      [provider]  warfarin (COUMADIN) 5 MG tablet Take 5-7.5 mg by mouth See admin instructions. Take 1.5 tablets (7.5 mg) by mouth on Monday, Wednesday, and Fridays Take 1 tablet (5 mg) by mouth Sunday, Tuesday, Thursday, and Saturday 04/06/17   [provider]    Family History Family History  Problem Relation Age of Onset  . Heart disease Mother   . Heart disease Father     Social History Social History  Substance Use Topics  . Smoking status: Never Smoker  . Smokeless tobacco: Never Used  . Alcohol use No     Allergies   Nitroglycerin; Ace inhibitors; and Beta adrenergic blockers   Review of Systems Review of Systems  Constitutional: Negative for activity change.  Respiratory: Negative for shortness of breath.   Cardiovascular: Negative for chest pain.  Gastrointestinal: Negative for abdominal pain.  Musculoskeletal: Negative for back pain.  Skin: Positive for wound. Negative for rash.  Neurological: Negative for weakness and numbness.    Physical Exam Updated Vital Signs BP 136/69 (BP  Location: Left Arm)   Pulse 68   Temp 97.8 F (36.6 C) (Oral)   Resp 18   Ht 6' (1.829 m)   Wt 97.6 kg (215 lb 2.7 oz)   SpO2 99%   BMI 29.18 kg/m   Physical Exam  Constitutional: He appears well-developed.  HENT:  Head: Normocephalic.  Eyes: Conjunctivae are normal.  Neck: Neck supple.  Cardiovascular: Normal rate and regular rhythm.   No murmur heard. Pulmonary/Chest: Effort normal.  Abdominal: Soft. He exhibits no distension.  Neurological: He is alert.  Skin: Skin is warm and dry.  5 cm hemostatic laceration to the left index finger that extends diagonally across the PIP joint.   Good strength against resistance of the dorsum and palmar surface of the digit. Radial pulses are  2+ and symmetric. Sensation is intact through out the four distal tips. Good capillary refill.   Psychiatric: His behavior is normal.  Nursing note and vitals reviewed.    ED Treatments / Results  Labs (all labs ordered are listed, but only abnormal results are displayed) Labs Reviewed - No data to display  EKG  EKG Interpretation None       Radiology No results found.  Procedures .Marland Kitchen.Laceration Repair Date/Time: 08/11/2017 1:43 PM Performed by: Lilian KapurMCDONALD, Semir Brill A Authorized by: Frederik PearMCDONALD, Jenayah Antu A   Consent:    Consent obtained:  Verbal   Consent given by:  Patient   Risks discussed:  Pain, poor cosmetic result, poor wound healing and infection   Alternatives discussed:  No treatment Anesthesia (see MAR for exact dosages):    Anesthesia method:  Local infiltration   Local anesthetic:  Lidocaine 2% w/o epi Laceration details:    Location: left index finger.   Length (cm):  5 Repair type:    Repair type:  Simple Pre-procedure details:    Preparation:  Patient was prepped and draped in usual sterile fashion Exploration:    Hemostasis achieved with:  Direct pressure and tourniquet   Wound extent: no fascia violation noted, no foreign bodies/material noted, no muscle damage noted, no  nerve damage noted, no tendon damage noted, no underlying fracture noted and no vascular damage noted     Contaminated: no   Treatment:    Area cleansed with:  Betadine and saline   Amount of cleaning:  Standard   Irrigation solution:  Sterile saline   Irrigation method:  Pressure wash   Visualized foreign bodies/material removed: no   Skin repair:    Repair method:  Sutures   Suture size:  4-0   Suture material:  Prolene   Suture technique:  Horizontal mattress   Number of sutures:  4 Approximation:    Approximation:  Close Post-procedure details:    Dressing:  Antibiotic ointment, splint for protection, sterile dressing and adhesive bandage   Patient tolerance of procedure:  Tolerated well, no immediate complications   (including critical care time)  Medications Ordered in ED Medications  lidocaine (XYLOCAINE) 2 % (with pres) injection 200 mg (200 mg Infiltration Given 08/11/17 1249)  Tdap (BOOSTRIX) injection 0.5 mL (0.5 mLs Intramuscular Given 08/11/17 1250)     Initial Impression / Assessment and Plan / ED Course  I have reviewed the triage vital signs and the nursing notes.  Pertinent labs & imaging results that were available during my care of the patient were reviewed by me and considered in my medical decision making (see chart for details).     Pressure irrigation performed. Wound explored and base of wound visualized in a bloodless field without evidence of foreign body.  Laceration occurred < 8 hours prior to repair which was well tolerated. Tdap updated.  Pt has DM Type II, which is well controlled. Pt discharged without antibiotics.  Discussed suture home care with patient and answered questions. Pt to follow-up for wound check and suture removal in 7 days; they are to return to the ED sooner for signs of infection. Pt is hemodynamically stable with no complaints prior to dc.   Final Clinical Impressions(s) / ED Diagnoses   Final diagnoses:  Laceration of left  index finger without foreign body without damage to nail, initial encounter    New Prescriptions Discharge Medication List as of 08/11/2017  1:53 PM       Wynetta Seith A, PA-C 08/12/17  2210    Nira Conn, MD 08/15/17 [phone]

## 2017-08-11 NOTE — ED Notes (Signed)
Pressure dressing reapplied after evaluation by provider

## 2017-08-11 NOTE — ED Provider Notes (Signed)
Medical screening examination/treatment/procedure(s) were conducted as a shared visit with non-physician practitioner(s) and myself.  I personally evaluated the patient during the encounter. Briefly, the patient is a 71 y.o. male with a fib on coumadin here for left index finger laceration PTA. Tetanus not UTD. On exam, pt has 4cm laceration to radial aspect of left index; NVI distally. Closed by APP. Tetanus updated. The patient is safe for discharge with strict return precautions.     EKG Interpretation None           Kaylana Fenstermacher, Amadeo Garnet, MD 08/11/17 1355

## 2017-08-11 NOTE — ED Triage Notes (Signed)
Pt states he cut left index finger on a box cutter at work approx 1115am-NAD-steady gait

## 2017-08-11 NOTE — ED Notes (Signed)
ED Provider at bedside. 

## 2018-11-29 ENCOUNTER — Other Ambulatory Visit: Payer: Self-pay | Admitting: Cardiology

## 2018-12-14 DIAGNOSIS — Z45018 Encounter for adjustment and management of other part of cardiac pacemaker: Secondary | ICD-10-CM

## 2018-12-14 DIAGNOSIS — I5042 Chronic combined systolic (congestive) and diastolic (congestive) heart failure: Secondary | ICD-10-CM

## 2018-12-14 DIAGNOSIS — Z950 Presence of cardiac pacemaker: Secondary | ICD-10-CM

## 2018-12-17 ENCOUNTER — Telehealth: Payer: Self-pay | Admitting: Cardiology

## 2018-12-17 NOTE — Telephone Encounter (Signed)
ICD alert 2.27.20: VVI CRT-D ICM. 1nsVT/16 bts. Patient had appointment with me in 2 days and will discuss. ROutine monitoring. CHF improved.

## 2019-01-14 DIAGNOSIS — I5042 Chronic combined systolic (congestive) and diastolic (congestive) heart failure: Secondary | ICD-10-CM

## 2019-01-14 DIAGNOSIS — Z4502 Encounter for adjustment and management of automatic implantable cardiac defibrillator: Secondary | ICD-10-CM | POA: Diagnosis not present

## 2019-01-14 DIAGNOSIS — Z95810 Presence of automatic (implantable) cardiac defibrillator: Secondary | ICD-10-CM

## 2019-01-15 ENCOUNTER — Ambulatory Visit: Payer: Self-pay

## 2019-01-21 ENCOUNTER — Telehealth: Payer: Self-pay | Admitting: Cardiology

## 2019-01-23 NOTE — Telephone Encounter (Signed)
Patient notified of normal ICD function

## 2019-01-25 ENCOUNTER — Other Ambulatory Visit: Payer: Self-pay | Admitting: Cardiology

## 2019-02-14 DIAGNOSIS — Z95810 Presence of automatic (implantable) cardiac defibrillator: Secondary | ICD-10-CM

## 2019-02-14 DIAGNOSIS — Z4502 Encounter for adjustment and management of automatic implantable cardiac defibrillator: Secondary | ICD-10-CM | POA: Diagnosis not present

## 2019-02-14 DIAGNOSIS — I5042 Chronic combined systolic (congestive) and diastolic (congestive) heart failure: Secondary | ICD-10-CM | POA: Diagnosis not present

## 2019-03-17 DIAGNOSIS — I5042 Chronic combined systolic (congestive) and diastolic (congestive) heart failure: Secondary | ICD-10-CM

## 2019-03-17 DIAGNOSIS — Z95810 Presence of automatic (implantable) cardiac defibrillator: Secondary | ICD-10-CM | POA: Diagnosis not present

## 2019-03-17 DIAGNOSIS — Z4502 Encounter for adjustment and management of automatic implantable cardiac defibrillator: Secondary | ICD-10-CM | POA: Diagnosis not present

## 2019-03-22 DIAGNOSIS — R69 Illness, unspecified: Secondary | ICD-10-CM | POA: Diagnosis not present

## 2019-03-28 ENCOUNTER — Encounter: Payer: Self-pay | Admitting: Cardiology

## 2019-03-28 DIAGNOSIS — Z4502 Encounter for adjustment and management of automatic implantable cardiac defibrillator: Secondary | ICD-10-CM | POA: Insufficient documentation

## 2019-04-04 DIAGNOSIS — Z7901 Long term (current) use of anticoagulants: Secondary | ICD-10-CM | POA: Diagnosis not present

## 2019-04-04 DIAGNOSIS — I480 Paroxysmal atrial fibrillation: Secondary | ICD-10-CM | POA: Diagnosis not present

## 2019-04-04 DIAGNOSIS — E1151 Type 2 diabetes mellitus with diabetic peripheral angiopathy without gangrene: Secondary | ICD-10-CM | POA: Diagnosis not present

## 2019-04-17 DIAGNOSIS — Z4502 Encounter for adjustment and management of automatic implantable cardiac defibrillator: Secondary | ICD-10-CM

## 2019-04-17 DIAGNOSIS — I5042 Chronic combined systolic (congestive) and diastolic (congestive) heart failure: Secondary | ICD-10-CM

## 2019-04-17 DIAGNOSIS — Z95810 Presence of automatic (implantable) cardiac defibrillator: Secondary | ICD-10-CM

## 2019-05-01 DIAGNOSIS — R69 Illness, unspecified: Secondary | ICD-10-CM | POA: Diagnosis not present

## 2019-05-02 ENCOUNTER — Ambulatory Visit: Payer: 59 | Admitting: Cardiology

## 2019-05-03 DIAGNOSIS — E119 Type 2 diabetes mellitus without complications: Secondary | ICD-10-CM | POA: Diagnosis not present

## 2019-05-03 DIAGNOSIS — I480 Paroxysmal atrial fibrillation: Secondary | ICD-10-CM | POA: Diagnosis not present

## 2019-05-03 DIAGNOSIS — Z7901 Long term (current) use of anticoagulants: Secondary | ICD-10-CM | POA: Diagnosis not present

## 2019-05-18 DIAGNOSIS — Z4502 Encounter for adjustment and management of automatic implantable cardiac defibrillator: Secondary | ICD-10-CM | POA: Diagnosis not present

## 2019-05-18 DIAGNOSIS — Z95810 Presence of automatic (implantable) cardiac defibrillator: Secondary | ICD-10-CM | POA: Diagnosis not present

## 2019-05-18 DIAGNOSIS — I5042 Chronic combined systolic (congestive) and diastolic (congestive) heart failure: Secondary | ICD-10-CM | POA: Diagnosis not present

## 2019-05-23 ENCOUNTER — Telehealth: Payer: Self-pay

## 2019-05-23 DIAGNOSIS — R69 Illness, unspecified: Secondary | ICD-10-CM | POA: Diagnosis not present

## 2019-05-23 NOTE — Telephone Encounter (Signed)
LMOM with details

## 2019-05-23 NOTE — Telephone Encounter (Signed)
-----   Message from Adrian Prows, MD sent at 05/18/2019 10:52 AM EDT ----- Regarding: ICD Normal function. No CHF. Please schedule OV and clinic check.

## 2019-05-31 ENCOUNTER — Encounter: Payer: Self-pay | Admitting: Cardiology

## 2019-06-01 ENCOUNTER — Other Ambulatory Visit: Payer: Self-pay

## 2019-06-01 ENCOUNTER — Ambulatory Visit (INDEPENDENT_AMBULATORY_CARE_PROVIDER_SITE_OTHER): Payer: Medicare HMO | Admitting: Cardiology

## 2019-06-01 ENCOUNTER — Encounter: Payer: Self-pay | Admitting: Cardiology

## 2019-06-01 VITALS — BP 156/89 | HR 72 | Ht 73.0 in | Wt 246.0 lb

## 2019-06-01 DIAGNOSIS — I5042 Chronic combined systolic (congestive) and diastolic (congestive) heart failure: Secondary | ICD-10-CM

## 2019-06-01 DIAGNOSIS — Z95810 Presence of automatic (implantable) cardiac defibrillator: Secondary | ICD-10-CM

## 2019-06-01 DIAGNOSIS — E7800 Pure hypercholesterolemia, unspecified: Secondary | ICD-10-CM

## 2019-06-01 DIAGNOSIS — Z4502 Encounter for adjustment and management of automatic implantable cardiac defibrillator: Secondary | ICD-10-CM

## 2019-06-01 DIAGNOSIS — I10 Essential (primary) hypertension: Secondary | ICD-10-CM

## 2019-06-01 DIAGNOSIS — I428 Other cardiomyopathies: Secondary | ICD-10-CM

## 2019-06-01 DIAGNOSIS — I4821 Permanent atrial fibrillation: Secondary | ICD-10-CM

## 2019-06-01 MED ORDER — SPIRONOLACTONE 25 MG PO TABS: 25.0000 mg | ORAL_TABLET | ORAL | 2 refills | Status: DC

## 2019-06-01 NOTE — Progress Notes (Signed)
Primary Physician/Referring:  Jarome MatinPaterson, Daniel, MD  Patient ID: [name], male    DOB: 03-01-1946, 73 y.o.   MRN: [id number]  Chief Complaint  Patient presents with  . Congestive Heart Failure  . Cardiomyopathy  . Follow-up   HPI:    HPI: [name]  is a 73 y.o.  nonischemic dilated cardiomyopathy, coronary angiogram on 09/14/2011 that revealed EF of 25%. He also has chronic atrial fibrillation and bronchial asthma, hypertension, diabetes mellitus, hyperlipidemia. He has history of Bi-V ICD implantation, placed on 10/05/2011. Since then his ejection fraction which was around 30% had normalized; however, was noted to be back down at 25% in May 2019. He was asymptomatic and without evidence of heart failure.  Patient is on a six-month office visit and follow-up of chronic systolic and diastolic heart failure, ICD device check.  Since Covid 19, his activity level has decreased significantly.  Also he has gained weight by about 15 pounds.   He denies any chest pain or shortness of breath except during active asthma. No leg edema, no symptoms of claudication or TIA. No syncope. No bleeding diathesis. Coumadin is managed by PCP and is stable.  Past Medical History:  Diagnosis Date  . Arthritis   . Asthma   . Chronic anticoagulation    followed by Dr. Eloise HarmanPaterson  . Chronic systolic heart failure (HCC)    Ejection fraction 25-30% catheter November 2012 with moderate mitral regurgitation  . Diabetes mellitus 10/05/11   "on the verge; don't take RX for it"  . Mitral regurgitation    moderate per echo in April of 2011  . Myocardial infarction (HCC) ~ 2005  . Syncope   . Tachy-brady syndrome [facility](HCC) Sept 2012   with NSVT on holter   Past Surgical History:  Procedure Laterality Date  . BI-VENTRICULAR IMPLANTABLE CARDIOVERTER DEFIBRILLATOR Left 10/05/2011   Procedure: BI-VENTRICULAR IMPLANTABLE CARDIOVERTER DEFIBRILLATOR  (CRT-D);  Surgeon: Duke SalviaSteven C Klein, MD;  [facility];  Service: Cardiovascular;  Laterality: Left;  . CARDIAC CATHETERIZATION    . CARDIAC DEFIBRILLATOR PLACEMENT  10/05/11  . ICD GENERATOR CHANGEOUT N/A 05/13/2017   Procedure: ICD Generator Changeout;  Surgeon: Duke SalviaKlein, Steven C, MD;  [facility];  Service: Cardiovascular;  Laterality: N/A;  . INGUINAL HERNIA REPAIR  1990's   right  . INSERT / REPLACE / REMOVE PACEMAKER  10/05/11   "first time"  . LEFT AND RIGHT HEART CATHETERIZATION WITH CORONARY ANGIOGRAM N/A 09/14/2011   Procedure: LEFT AND RIGHT HEART CATHETERIZATION WITH CORONARY ANGIOGRAM;  Surgeon: Pamella PertJagadeesh R Daden Mahany, MD;  [facility];  Service: Cardiovascular;  Laterality: N/A;  . repair of right index finger  ~ 1990's  . surgery for an infected right sacroiliac joint  ~ 2006  . TONSILLECTOMY  ~ 1955  . US ECHOCARDIOGRAPHY  Sept 2012`   EF 30 to 35%   Social History   Socioeconomic History  . Marital status: Married    Spouse name: Not on file  . Number of children: 3  . Years of education: Not on file  . Highest education level: Not on file  Occupational History  . Not on file  Social Needs  . Financial resource strain: Not on file  . Food insecurity    Worry: Not on file    Inability: Not on file  . Transportation needs    Medical: Not on file    Non-medical: Not on file  Tobacco Use  . Smoking status: Never Smoker  .  Smokeless tobacco: Never Used  Substance and Sexual Activity  . Alcohol use: No  . Drug use: No  . Sexual activity: Not on file  Lifestyle  . Physical activity    Days per week: Not on file    Minutes per session: Not on file  . Stress: Not on file  Relationships  . Social Musicianconnections    Talks on phone: Not on file    Gets together: Not on file    Attends religious service: Not on file    Active member of club or organization: Not on file    Attends meetings of clubs or organizations: Not on file    Relationship status: Not on file  . Intimate partner violence     Fear of current or ex partner: Not on file    Emotionally abused: Not on file    Physically abused: Not on file    Forced sexual activity: Not on file  Other Topics Concern  . Not on file  Social History Narrative  . Not on file   ROS  Review of Systems  Constitution: Negative for chills, decreased appetite, malaise/fatigue and weight gain.  Cardiovascular: Negative for dyspnea on exertion, leg swelling and syncope.  Respiratory: Positive for shortness of breath (has h/o asthma). Negative for cough.   Endocrine: Negative for cold intolerance.  Hematologic/Lymphatic: Does not bruise/bleed easily.  Musculoskeletal: Negative for joint swelling.  Gastrointestinal: Negative for abdominal pain, anorexia, change in bowel habit, hematochezia and melena.  Neurological: Negative for headaches and light-headedness.  Psychiatric/Behavioral: Negative for depression and substance abuse.  All other systems reviewed and are negative.  Objective  Blood pressure (!) 156/89, pulse 72, height 6\' 1"  (1.854 m), weight 246 lb (111.6 kg), SpO2 96 %. Body mass index is 32.46 kg/m.   Physical Exam  Constitutional: He appears well-developed and well-nourished. No distress.  HENT:  Head: Atraumatic.  Eyes: Conjunctivae are normal.  Neck: Neck supple. No JVD present. No thyromegaly present.  Cardiovascular: Normal rate, regular rhythm, normal heart sounds and intact distal pulses. Exam reveals no gallop.  No murmur heard. No JVD, No leg edema.  Pulmonary/Chest: Effort normal and breath sounds normal.  Pacemaker/ICD site noted  in the left infraclavicular fossa.    Abdominal: Soft. Bowel sounds are normal.  Musculoskeletal: Normal range of motion.  Neurological: He is alert.  Skin: Skin is warm and dry.  Psychiatric: He has a normal mood and affect.    Radiology: No results found.  Laboratory examination:   No results for input(s): NA, K, CL, CO2, GLUCOSE, BUN, CREATININE, CALCIUM, GFRNONAA, GFRAA  in the last 8760 hours. CMP Latest Ref Rng & Units 05/02/2017 09/30/2011 09/14/2011  Glucose 65 - 99 mg/dL 86 161(W100(H) 960(A131(H)  BUN 8 - 27 mg/dL 19 23 54(U26(H)  Creatinine 0.76 - 1.27 mg/dL 9.81(X1.50(H) 1.0 9.141.23  Sodium 134 - 144 mmol/L 140 142 141  Potassium 3.5 - 5.2 mmol/L 4.6 4.0 4.3  Chloride 96 - 106 mmol/L 104 110 108  CO2 20 - 29 mmol/L 18(L) 25 21  Calcium 8.6 - 10.2 mg/dL 9.3 8.9 8.7  Total Protein 6.0 - 8.3 g/dL - - -  Total Bilirubin 0.3 - 1.2 mg/dL - - -  Alkaline Phos 39 - 117 U/L - - -  AST 0 - 37 U/L - - -  ALT 0 - 53 U/L - - -   CBC Latest Ref Rng & Units 05/02/2017 09/30/2011 07/12/2011  WBC 3.4 - 10.8 x10E3/uL 8.1 6.3  9.3  Hemoglobin 13.0 - 17.7 g/dL 12.7(L) 12.8(L) 15.1  Hematocrit 37.5 - 51.0 % 37.4(L) 37.8(L) 43.7  Platelets 150 - 379 x10E3/uL 178 169.0 173   Lipid Panel  No results found for: CHOL, TRIG, HDL, CHOLHDL, VLDL, LDLCALC, LDLDIRECT HEMOGLOBIN A1C No results found for: HGBA1C, MPG TSH No results for input(s): TSH in the last 8760 hours. Medications   Current Outpatient Medications  Medication Instructions  . acetaminophen (TYLENOL) 1,000 mg, Oral, Daily at bedtime  . albuterol (PROVENTIL HFA;VENTOLIN HFA) 108 (90 Base) MCG/ACT inhaler 2 puffs, Inhalation, Every 6 hours PRN  . albuterol (PROVENTIL) 2.5 mg, Every 6 hours PRN  . digoxin (LANOXIN) 0.25 mg, Oral, Daily  . diltiazem (CARDIZEM CD) 180 mg, Oral, Daily  . fish oil-omega-3 fatty acids 1,000 mg, Oral, 2 times daily  . Fluticasone-Salmeterol (ADVAIR DISKUS) 250-50 MCG/DOSE AEPB 1 puff, Every 12 hours  . furosemide (LASIX) 40 mg, Daily  . glimepiride (AMARYL) 2 mg, Oral, Daily  . guaiFENesin (MUCINEX) 600 mg, Oral, 2 times daily PRN  . metFORMIN (GLUCOPHAGE-XR) 1,000 mg, Oral, 2 times daily  . metoCLOPramide (REGLAN) 5 mg, Daily  . metoprolol succinate (TOPROL-XL) 100 MG 24 hr tablet TAKE 1 TABLET BY MOUTH DAILY  . olmesartan (BENICAR) 40 mg, Oral, Daily  . roflumilast (DALIRESP) 500 mcg, Daily  .  spironolactone (ALDACTONE) 25 mg, Oral, BH-each morning  . warfarin (COUMADIN) 5-7.5 mg, Oral, See admin instructions, Take 1.5 tablets (7.5 mg) by mouth on Monday, Wednesday, and FridaysTake 1 tablet (5 mg) by mouth Sunday, Tuesday, Thursday, and Saturday    Cardiac Studies:   Coronary angiogram11/27/2012:  Nonischemic cardiomyopathy with severe LV systolic dysfunction, EF 25%.  Slow coronary filling suggest microvascular disease.  No significant coronary artery disease.  No pulmonary hypertension.   Echocardiogram 09/12/2018: Left ventricle cavity is normal in size. Moderate concentric hypertrophy of the left ventricle. Mild decrease in global wall motion. Septal wall motion abnormality due to paced rhythm.  Diastolic function assessment limited due to paced rhythm.  Calculated EF 43%. Left atrial cavity is severely dilated. Mild aortic valve leaflet thickening with moderate calcification. Mild (Grade I) aortic regurgitation. Moderate (Grade III) mitral regurgitation. Mild tricuspid regurgitation. IVC is dilated with respiratory variation. Estimated RA pressure 8 mmHg. Compared to previous study on 03/13/2018, LVEF is mildly improved.   Assessment     ICD-10-CM   1. Encounter for adjustment of biventricular implantable cardioverter-defibrillator (ICD)  Z45.02   2. ICD Biventricular Med[employer]onic Claria MRI CRTD DTMA1D1 situ  Z95.810   3. Chronic combined systolic and diastolic CHF (congestive heart failure) (HCC)  I50.42 EKG 12-Lead    spironolactone (ALDACTONE) 25 MG tablet  4. NICM (nonischemic cardiomyopathy) (HCC)  I42.8   5. Essential hypertension  I10 spironolactone (ALDACTONE) 25 MG tablet    Basic metabolic panel  6. Permanent atrial fibrillation  I48.21    EKG 06/01/2019: Electronic ventricular pacemaker, BB pacemaker detected Pacemaker ECG, No further analysis    Remote ICD check 7.23.20: No VHR episodes. Trans-thoracic impedance trends and the OptiVol Fluid Index do no  present significant abnormalities. Battery longevity is 2.7-3.9 years. BV pacing is 99.9 %.  Scheduled In office ICD check 06/01/2019: One VHR (6 beat NSVT). Impedance and thresholds within normal limits, all 3 and LV lead threshold capture at 1.25 V at 0.60 ms, We will reduce BV amplitude to 2.50 to improve battery longevity. Trans-thoracic impedance trends and the OptiVol Fluid Index do no present significant abnormalities. Longevity 3.2 years. Cardiac compass  also reveal decreased daily activity.  Recommendations:   Patient is on a six-month office visit and follow-up of chronic systolic and diastolic heart failure, ICD device check.  Since Covid 19, his activity level has decreased significantly.  Also he has gained weight by about 15 pounds.  No clinical evidence of congestive heart failure.  His blood pressure is no elevated, suspect this is due to weight gain.  In view of bronchial asthma, cannot increase metoprolol succinate any further.  Do not want to increase the dose of diltiazem due to cardiomyopathy.  I will add spironolactone 25 mg every morning, would like to obtain labs, patient states that he has not had routine labs done in quite a while, CMP, CBC, lipid profile testing that ordered.  His Coumadin is being managed by his PCP Dr. Philip Aspen.  States that it is under good control.  I'd like to see him back in 6 weeks for follow-up of hypertension.  I have reviewed the data of the ICD, functioning normally.  He has not had any significant arrhythmias.  Adrian Prows, MD, [facility] 06/01/2019, 2:04 PM [facility] [place] Pager: [phone] Office: [phone] If no answer Cell [phone]

## 2019-06-08 ENCOUNTER — Encounter: Payer: Self-pay | Admitting: Cardiology

## 2019-06-13 DIAGNOSIS — Z7901 Long term (current) use of anticoagulants: Secondary | ICD-10-CM | POA: Diagnosis not present

## 2019-06-13 DIAGNOSIS — E119 Type 2 diabetes mellitus without complications: Secondary | ICD-10-CM | POA: Diagnosis not present

## 2019-06-13 DIAGNOSIS — I480 Paroxysmal atrial fibrillation: Secondary | ICD-10-CM | POA: Diagnosis not present

## 2019-06-18 DIAGNOSIS — Z95810 Presence of automatic (implantable) cardiac defibrillator: Secondary | ICD-10-CM

## 2019-06-18 DIAGNOSIS — Z4502 Encounter for adjustment and management of automatic implantable cardiac defibrillator: Secondary | ICD-10-CM

## 2019-06-18 DIAGNOSIS — R69 Illness, unspecified: Secondary | ICD-10-CM | POA: Diagnosis not present

## 2019-06-18 DIAGNOSIS — I5042 Chronic combined systolic (congestive) and diastolic (congestive) heart failure: Secondary | ICD-10-CM

## 2019-06-20 DIAGNOSIS — E1151 Type 2 diabetes mellitus with diabetic peripheral angiopathy without gangrene: Secondary | ICD-10-CM | POA: Diagnosis not present

## 2019-06-24 DIAGNOSIS — R69 Illness, unspecified: Secondary | ICD-10-CM | POA: Diagnosis not present

## 2019-06-26 ENCOUNTER — Telehealth: Payer: Self-pay

## 2019-06-26 NOTE — Telephone Encounter (Signed)
Pt aware of transmission  

## 2019-06-26 NOTE — Telephone Encounter (Signed)
-----   Message from Adrian Prows, MD sent at 06/20/2019  8:25 AM EDT ----- Regarding: ICD No CHF on exam. Normal function

## 2019-07-13 ENCOUNTER — Ambulatory Visit: Payer: Medicare HMO | Admitting: Cardiology

## 2019-07-18 ENCOUNTER — Encounter: Payer: Self-pay | Admitting: Cardiology

## 2019-07-18 ENCOUNTER — Ambulatory Visit: Payer: Medicare HMO | Admitting: Cardiology

## 2019-07-18 ENCOUNTER — Other Ambulatory Visit: Payer: Self-pay

## 2019-07-18 VITALS — BP 131/81 | HR 70 | Ht 73.0 in | Wt 245.0 lb

## 2019-07-18 DIAGNOSIS — I428 Other cardiomyopathies: Secondary | ICD-10-CM

## 2019-07-18 DIAGNOSIS — I5042 Chronic combined systolic (congestive) and diastolic (congestive) heart failure: Secondary | ICD-10-CM | POA: Diagnosis not present

## 2019-07-18 DIAGNOSIS — Z95810 Presence of automatic (implantable) cardiac defibrillator: Secondary | ICD-10-CM

## 2019-07-18 DIAGNOSIS — I10 Essential (primary) hypertension: Secondary | ICD-10-CM

## 2019-07-18 NOTE — Progress Notes (Signed)
Primary Physician/Referring:  [name], Daniel, MD  Patient ID: [name], male    DOB: Aug 16, 1946, 73 y.o.   MRN: [id number]  Chief Complaint  Patient presents with  . Hypertension  . Congestive Heart Failure  . Follow-up   HPI:    HPI: Rickey KillingsStephen [name]  is a 73 y.o.  nonischemic dilated cardiomyopathy, coronary angiogram on 09/14/2011 that revealed EF of 25%, permanent atrial fibrillation and bronchial asthma, hypertension, diabetes mellitus, hyperlipidemia.   He has history of Bi-V ICD implantation, placed on 10/05/2011. Since then his ejection fraction which was around 30% had normalized; however, was noted to be back down at 25% in May 2019. He was asymptomatic and without evidence of heart failure.  This is a 6 week office visit after had initiated spironolactone for uncontrolled hypertension.  States that he has made lifestyle changes and has reduced his calorie intake and states that he has lost 6 pounds in weight.  He denies any chest pain or shortness of breath except during active asthma. No leg edema, no symptoms of claudication or TIA. No syncope. No bleeding diathesis. Coumadin is managed by PCP and is stable.  Past Medical History:  Diagnosis Date  . Arthritis   . Asthma   . Chronic anticoagulation    followed by Dr. Eloise HarmanPaterson  . Chronic combined systolic and diastolic heart failure (HCC) 10/25/2012  . Chronic systolic heart failure (HCC)    Ejection fraction 25-30% catheter November 2012 with moderate mitral regurgitation  . Diabetes mellitus 10/05/11   "on the verge; don't take RX for it"  . Mitral regurgitation    moderate per echo in April of 2011  . Myocardial infarction (HCC) ~ 2005  . Syncope   . Tachy-brady syndrome [facility](HCC) Sept 2012   with NSVT on holter   Past Surgical History:  Procedure Laterality Date  . BI-VENTRICULAR IMPLANTABLE CARDIOVERTER DEFIBRILLATOR Left 10/05/2011   Procedure: BI-VENTRICULAR IMPLANTABLE CARDIOVERTER DEFIBRILLATOR   (CRT-D);  Surgeon: Duke SalviaSteven C Klein, MD;  [facility];  Service: Cardiovascular;  Laterality: Left;  . CARDIAC CATHETERIZATION    . CARDIAC DEFIBRILLATOR PLACEMENT  10/05/11  . ICD GENERATOR CHANGEOUT N/A 05/13/2017   Procedure: ICD Generator Changeout;  Surgeon: Duke SalviaKlein, Steven C, MD;  [facility];  Service: Cardiovascular;  Laterality: N/A;  . INGUINAL HERNIA REPAIR  1990's   right  . INSERT / REPLACE / REMOVE PACEMAKER  10/05/11   "first time"  . LEFT AND RIGHT HEART CATHETERIZATION WITH CORONARY ANGIOGRAM N/A 09/14/2011   Procedure: LEFT AND RIGHT HEART CATHETERIZATION WITH CORONARY ANGIOGRAM;  Surgeon: Pamella PertJagadeesh R Makila Colombe, MD;  [facility];  Service: Cardiovascular;  Laterality: N/A;  . repair of right index finger  ~ 1990's  . surgery for an infected right sacroiliac joint  ~ 2006  . TONSILLECTOMY  ~ 1955  . US ECHOCARDIOGRAPHY  Sept 2012`   EF 30 to 35%   Social History   Socioeconomic History  . Marital status: Married    Spouse name: Not on file  . Number of children: 3  . Years of education: Not on file  . Highest education level: Not on file  Occupational History  . Not on file  Social Needs  . Financial resource strain: Not on file  . Food insecurity    Worry: Not on file    Inability: Not on file  . Transportation needs    Medical: Not on file    Non-medical: Not on file  Tobacco Use  . Smoking status: Never Smoker  . Smokeless tobacco: Never Used  Substance and Sexual Activity  . Alcohol use: No  . Drug use: No  . Sexual activity: Not on file  Lifestyle  . Physical activity    Days per week: Not on file    Minutes per session: Not on file  . Stress: Not on file  Relationships  . Social Musician on phone: Not on file    Gets together: Not on file    Attends religious service: Not on file    Active member of club or organization: Not on file    Attends meetings of clubs or organizations: Not on file     Relationship status: Not on file  . Intimate partner violence    Fear of current or ex partner: Not on file    Emotionally abused: Not on file    Physically abused: Not on file    Forced sexual activity: Not on file  Other Topics Concern  . Not on file  Social History Narrative  . Not on file   ROS  Review of Systems  Constitution: Negative for chills, decreased appetite, malaise/fatigue and weight gain.  Cardiovascular: Negative for dyspnea on exertion, leg swelling and syncope.  Respiratory: Positive for shortness of breath (has h/o asthma). Negative for cough.   Endocrine: Negative for cold intolerance.  Hematologic/Lymphatic: Does not bruise/bleed easily.  Musculoskeletal: Negative for joint swelling.  Gastrointestinal: Negative for abdominal pain, anorexia, change in bowel habit, hematochezia and melena.  Neurological: Negative for headaches and light-headedness.  Psychiatric/Behavioral: Negative for depression and substance abuse.  All other systems reviewed and are negative.  Objective  Blood pressure 131/81, pulse 70, height 6\' 1"  (1.854 m), weight 245 lb (111.1 kg), SpO2 97 %. Body mass index is 32.32 kg/m. Vitals with BMI 07/18/2019 06/01/2019 08/11/2017  Height 6\' 1"  6\' 1"  -  Weight 245 lbs 246 lbs -  BMI 32.33 32.46 -  Systolic 131 156 08/13/2017  Diastolic 81 89 69  Pulse 70 72 68      Physical Exam  Constitutional: He appears well-developed and well-nourished. No distress.  HENT:  Head: Atraumatic.  Eyes: Conjunctivae are normal.  Neck: Neck supple. No JVD present. No thyromegaly present.  Cardiovascular: Normal rate, regular rhythm, normal heart sounds and intact distal pulses. Exam reveals no gallop.  No murmur heard. No JVD, No leg edema.  Pulmonary/Chest: Effort normal and breath sounds normal.  Pacemaker/ICD site noted  in the left infraclavicular fossa.    Abdominal: Soft. Bowel sounds are normal.  Musculoskeletal: Normal range of motion.  Neurological: He  is alert.  Skin: Skin is warm and dry.  Psychiatric: He has a normal mood and affect.    Radiology: No results found.  Laboratory examination:   No results for input(s): NA, K, CL, CO2, GLUCOSE, BUN, CREATININE, CALCIUM, GFRNONAA, GFRAA in the last 8760 hours. CMP Latest Ref Rng & Units 05/02/2017 09/30/2011 09/14/2011  Glucose 65 - 99 mg/dL 86 05/04/2017) 10/02/2011)  BUN 8 - 27 mg/dL 19 23 09/16/2011)  Creatinine 0.76 - 1.27 mg/dL 599(J) 1.0 570(V  Sodium 134 - 144 mmol/L 140 142 141  Potassium 3.5 - 5.2 mmol/L 4.6 4.0 4.3  Chloride 96 - 106 mmol/L 104 110 108  CO2 20 - 29 mmol/L 18(L) 25 21  Calcium 8.6 - 10.2 mg/dL 9.3 8.9 8.7  Total Protein 6.0 - 8.3 g/dL - - -  Total Bilirubin 0.3 -  1.2 mg/dL - - -  Alkaline Phos 39 - 117 U/L - - -  AST 0 - 37 U/L - - -  ALT 0 - 53 U/L - - -   CBC Latest Ref Rng & Units 05/02/2017 09/30/2011 07/12/2011  WBC 3.4 - 10.8 x10E3/uL 8.1 6.3 9.3  Hemoglobin 13.0 - 17.7 g/dL 12.7(L) 12.8(L) 15.1  Hematocrit 37.5 - 51.0 % 37.4(L) 37.8(L) 43.7  Platelets 150 - 379 x10E3/uL 178 169.0 173   Lipid Panel  No results found for: CHOL, TRIG, HDL, CHOLHDL, VLDL, LDLCALC, LDLDIRECT HEMOGLOBIN A1C No results found for: HGBA1C, MPG TSH No results for input(s): TSH in the last 8760 hours. Medications   Current Outpatient Medications  Medication Instructions  . acetaminophen (TYLENOL) 1,000 mg, Oral, Daily at bedtime  . albuterol (PROVENTIL HFA;VENTOLIN HFA) 108 (90 Base) MCG/ACT inhaler 2 puffs, Inhalation, Every 6 hours PRN  . albuterol (PROVENTIL) 2.5 mg, Every 6 hours PRN  . diltiazem (CARDIZEM CD) 180 mg, Oral, Daily  . fish oil-omega-3 fatty acids 1,000 mg, Oral, 2 times daily  . Fluticasone-Salmeterol (ADVAIR DISKUS) 250-50 MCG/DOSE AEPB 1 puff, Every 12 hours  . glimepiride (AMARYL) 2 mg, Oral, Daily  . guaiFENesin (MUCINEX) 600 mg, Oral, 2 times daily PRN  . metFORMIN (GLUCOPHAGE-XR) 1,000 mg, Oral, 2 times daily  . metoCLOPramide (REGLAN) 5 mg, Daily  .  metoprolol succinate (TOPROL-XL) 100 MG 24 hr tablet TAKE 1 TABLET BY MOUTH DAILY  . olmesartan (BENICAR) 20 mg, Oral, Daily  . roflumilast (DALIRESP) 500 mcg, Daily  . spironolactone (ALDACTONE) 25 mg, Oral, BH-each morning  . warfarin (COUMADIN) 5-7.5 mg, Oral, See admin instructions, Take 1.5 tablets (7.5 mg) by mouth on Monday, Wednesday, and FridaysTake 1 tablet (5 mg) by mouth Sunday, Tuesday, Thursday, and Saturday    Cardiac Studies:   Coronary angiogram11/27/2012:  Nonischemic cardiomyopathy with severe LV systolic dysfunction, EF 44%.  Slow coronary filling suggest microvascular disease.  No significant coronary artery disease.  No pulmonary hypertension.   Echocardiogram 09/12/2018: Left ventricle cavity is normal in size. Moderate concentric hypertrophy of the left ventricle. Mild decrease in global wall motion. Septal wall motion abnormality due to paced rhythm.  Diastolic function assessment limited due to paced rhythm.  Calculated EF 43%. Left atrial cavity is severely dilated. Mild aortic valve leaflet thickening with moderate calcification. Mild (Grade I) aortic regurgitation. Moderate (Grade III) mitral regurgitation. Mild tricuspid regurgitation. IVC is dilated with respiratory variation. Estimated RA pressure 8 mmHg. Compared to previous study on 03/13/2018, LVEF is mildly improved.   Assessment     ICD-10-CM   1. Chronic combined systolic and diastolic CHF (congestive heart failure) (HCC)  I50.42   2. Essential hypertension  I10   3. NICM (nonischemic cardiomyopathy) ([place])  I42.8   4. ICD Biventricular Medtronic Claria MRI CRTD [id number] situ  Z95.810    EKG 06/01/2019: Electronic ventricular pacemaker, BB pacemaker detected Pacemaker ECG, No further analysis    Remote ICD check 07/12/2019: There were 92 Vent sense episodes detected for 35 secs/day. Health trends (patient activity, heart rate variability, average heart rates) are stable. Trans-thoracic impedance  trends and the OptiVol Fluid Index do no present. significant abnormalities. Battery longevity is 2.0-3.2 years. RV pacing is 100 %.   Scheduled In office ICD check 06/01/2019: One VHR (6 beat NSVT). Impedance and thresholds within normal limits, all 3 and LV lead threshold capture at 1.25 V at 0.60 ms, We will reduce BV amplitude to 2.50 to improve battery longevity. Trans-thoracic  impedance trends and the OptiVol Fluid Index do no present significant abnormalities. Longevity 3.2 years. Cardiac compass also reveal decreased daily activity.  Recommendations:   Patient was seen by me 6 weeks ago for evaluation of nonischemic cardiomyopathy, hypertension and congestive heart failure.  I had added spironolactone 25 mg once a day, which is tolerating, blood pressure is now very well controlled and patient states that he did get blood work done with his PCP Dr. Dossie Arbour and told to be normal including normal lipids and BMP.  I placed a BMP ordered in view of adding spironolactone.  I'll try to obtain the results of the labs.  Patient is also made significant lifestyle changes, he has lost 6 pounds in weight.  I'll congratulated him and given him positive reinforcement.  I'll see him back in 6 months.  Yates Decamp, MD, [facility] 07/18/2019, 10:09 AM [facility] [place] Pager: [phone] Office: [phone] If no answer Cell [phone]

## 2019-07-19 DIAGNOSIS — Z7901 Long term (current) use of anticoagulants: Secondary | ICD-10-CM | POA: Diagnosis not present

## 2019-07-19 DIAGNOSIS — Z95810 Presence of automatic (implantable) cardiac defibrillator: Secondary | ICD-10-CM

## 2019-07-19 DIAGNOSIS — I480 Paroxysmal atrial fibrillation: Secondary | ICD-10-CM | POA: Diagnosis not present

## 2019-07-19 DIAGNOSIS — I5042 Chronic combined systolic (congestive) and diastolic (congestive) heart failure: Secondary | ICD-10-CM

## 2019-07-19 DIAGNOSIS — Z4502 Encounter for adjustment and management of automatic implantable cardiac defibrillator: Secondary | ICD-10-CM

## 2019-08-10 DIAGNOSIS — R69 Illness, unspecified: Secondary | ICD-10-CM | POA: Diagnosis not present

## 2019-08-15 ENCOUNTER — Other Ambulatory Visit: Payer: Self-pay

## 2019-08-15 MED ORDER — METOPROLOL SUCCINATE ER 100 MG PO TB24: 100.0000 mg | ORAL_TABLET | Freq: Every day | ORAL | 1 refills | Status: DC

## 2019-08-15 MED ORDER — DILTIAZEM HCL ER COATED BEADS 180 MG PO CP24: 180.0000 mg | ORAL_CAPSULE | Freq: Every day | ORAL | 1 refills | Status: DC

## 2019-08-16 ENCOUNTER — Telehealth: Payer: Self-pay

## 2019-08-16 NOTE — Telephone Encounter (Signed)
-----   Message from Adrian Prows, MD sent at 08/12/2019  5:03 PM EDT ----- Regarding: ICD Normal function. Let him know I am very proud as the ICD is not showing any signs of heart failure. Changes to his diet is working. JG

## 2019-08-16 NOTE — Telephone Encounter (Signed)
LVM with details.

## 2019-08-23 ENCOUNTER — Other Ambulatory Visit: Payer: Self-pay | Admitting: Cardiology

## 2019-08-23 DIAGNOSIS — I5042 Chronic combined systolic (congestive) and diastolic (congestive) heart failure: Secondary | ICD-10-CM

## 2019-08-23 DIAGNOSIS — I10 Essential (primary) hypertension: Secondary | ICD-10-CM

## 2019-09-19 DIAGNOSIS — Z95810 Presence of automatic (implantable) cardiac defibrillator: Secondary | ICD-10-CM

## 2019-09-19 DIAGNOSIS — Z4502 Encounter for adjustment and management of automatic implantable cardiac defibrillator: Secondary | ICD-10-CM

## 2019-09-19 DIAGNOSIS — I5042 Chronic combined systolic (congestive) and diastolic (congestive) heart failure: Secondary | ICD-10-CM

## 2019-09-25 ENCOUNTER — Telehealth: Payer: Self-pay

## 2019-09-25 NOTE — Telephone Encounter (Signed)
-----   Message from Adrian Prows, MD sent at 09/23/2019  5:46 PM EST ----- Regarding: ICD Showing mild heart failure signs again. He was doing well with diet and he needs to be careful. Otherwise normal function  JG

## 2019-09-25 NOTE — Telephone Encounter (Signed)
Pt aware and will call if he needs us

## 2019-10-01 DIAGNOSIS — R0600 Dyspnea, unspecified: Secondary | ICD-10-CM | POA: Diagnosis not present

## 2019-10-01 DIAGNOSIS — Z1331 Encounter for screening for depression: Secondary | ICD-10-CM | POA: Diagnosis not present

## 2019-10-01 DIAGNOSIS — E1151 Type 2 diabetes mellitus with diabetic peripheral angiopathy without gangrene: Secondary | ICD-10-CM | POA: Diagnosis not present

## 2019-10-01 DIAGNOSIS — I480 Paroxysmal atrial fibrillation: Secondary | ICD-10-CM | POA: Diagnosis not present

## 2019-10-01 DIAGNOSIS — J45909 Unspecified asthma, uncomplicated: Secondary | ICD-10-CM | POA: Diagnosis not present

## 2019-10-01 DIAGNOSIS — Z7901 Long term (current) use of anticoagulants: Secondary | ICD-10-CM | POA: Diagnosis not present

## 2019-10-03 DIAGNOSIS — R69 Illness, unspecified: Secondary | ICD-10-CM | POA: Diagnosis not present

## 2019-10-18 ENCOUNTER — Telehealth: Payer: Self-pay

## 2019-10-18 NOTE — Telephone Encounter (Signed)
Unable to reach pt as phone # not in service

## 2019-10-18 NOTE — Telephone Encounter (Signed)
-----   Message from Adrian Prows, MD sent at 10/16/2019 10:15 PM EST ----- Regarding: ICD Scheduled Remote ICD check 10/11/2019: No VHR episodes. Health trends (patient activity, heart rate variability, average heart rates) are stable. Trans-thoracic impedance trends and the Optivol Fluid Index suggest recent fluid accumulation, however trend has returned to baseline. Battery longevity is 2.0 years. RV pacing is 99.8 %, and LV pacing is 99.8 %.  Normal function. No heart failure suggestion and fluid accumulation has returned to normal after a recent spike.  HNY. JG

## 2019-10-20 DIAGNOSIS — Z95810 Presence of automatic (implantable) cardiac defibrillator: Secondary | ICD-10-CM

## 2019-10-20 DIAGNOSIS — I5042 Chronic combined systolic (congestive) and diastolic (congestive) heart failure: Secondary | ICD-10-CM

## 2019-10-20 DIAGNOSIS — Z4502 Encounter for adjustment and management of automatic implantable cardiac defibrillator: Secondary | ICD-10-CM

## 2019-11-05 ENCOUNTER — Other Ambulatory Visit: Payer: Self-pay

## 2019-11-05 DIAGNOSIS — I5042 Chronic combined systolic (congestive) and diastolic (congestive) heart failure: Secondary | ICD-10-CM

## 2019-11-05 DIAGNOSIS — R69 Illness, unspecified: Secondary | ICD-10-CM | POA: Diagnosis not present

## 2019-11-05 DIAGNOSIS — I10 Essential (primary) hypertension: Secondary | ICD-10-CM

## 2019-11-05 MED ORDER — SPIRONOLACTONE 25 MG PO TABS: ORAL_TABLET | ORAL | 0 refills | Status: DC

## 2019-11-12 ENCOUNTER — Other Ambulatory Visit: Payer: Self-pay | Admitting: Cardiology

## 2019-11-18 ENCOUNTER — Other Ambulatory Visit: Payer: Self-pay | Admitting: Cardiology

## 2019-11-18 DIAGNOSIS — I5042 Chronic combined systolic (congestive) and diastolic (congestive) heart failure: Secondary | ICD-10-CM

## 2019-11-18 DIAGNOSIS — I10 Essential (primary) hypertension: Secondary | ICD-10-CM

## 2019-11-20 DIAGNOSIS — Z95810 Presence of automatic (implantable) cardiac defibrillator: Secondary | ICD-10-CM | POA: Diagnosis not present

## 2019-11-20 DIAGNOSIS — Z4502 Encounter for adjustment and management of automatic implantable cardiac defibrillator: Secondary | ICD-10-CM | POA: Diagnosis not present

## 2019-11-20 DIAGNOSIS — I5042 Chronic combined systolic (congestive) and diastolic (congestive) heart failure: Secondary | ICD-10-CM | POA: Diagnosis not present

## 2019-12-04 ENCOUNTER — Telehealth: Payer: Self-pay

## 2019-12-04 NOTE — Telephone Encounter (Signed)
-----   Message from Yates Decamp, MD sent at 11/23/2019 12:13 AM EST ----- Regarding: ICD Scheduled Remote ICD check 10/20/2019: No VHR episodes. Health trends (patient activity, heart rate variability, average heart rates) are stable. Trans-thoracic impedance trends and the Optivol Fluid Index do not suggest fluid accumulation. Battery longevity is 1.9 years. RV pacing is 99.8 %.  Normal function and no suggestion of CHF (Congestive heart failure). Good job.  JG

## 2019-12-04 NOTE — Telephone Encounter (Signed)
Called and spoke with patient regarding his ICD check results.

## 2019-12-17 ENCOUNTER — Telehealth: Payer: Self-pay

## 2019-12-17 NOTE — Telephone Encounter (Signed)
-----   Message from Yates Decamp, MD sent at 12/16/2019  9:42 AM EST ----- Regarding: ICD Scheduled Remote ICD check 12/13/2019: No VHR episodes. Health trends (patient activity, heart rate variability, average heart rates) are stable. Trans-thoracic impedance trends and the Optivol Fluid Index do not suggest fluid accumulation. Battery longevity is 1.8 years. RV pacing is 99.8 %.  Normal function. No signs of fluid excess. Good job.  JG

## 2019-12-17 NOTE — Telephone Encounter (Signed)
Patient called back and is aware of results of ICD check.

## 2019-12-17 NOTE — Telephone Encounter (Signed)
Called patient, VMailbox if full and cannot receive any messages at this time.

## 2019-12-21 DIAGNOSIS — Z4502 Encounter for adjustment and management of automatic implantable cardiac defibrillator: Secondary | ICD-10-CM | POA: Diagnosis not present

## 2019-12-21 DIAGNOSIS — Z95810 Presence of automatic (implantable) cardiac defibrillator: Secondary | ICD-10-CM | POA: Diagnosis not present

## 2019-12-21 DIAGNOSIS — I5042 Chronic combined systolic (congestive) and diastolic (congestive) heart failure: Secondary | ICD-10-CM | POA: Diagnosis not present

## 2020-01-15 ENCOUNTER — Ambulatory Visit: Payer: Medicare HMO | Admitting: Cardiology

## 2020-01-15 DIAGNOSIS — E1169 Type 2 diabetes mellitus with other specified complication: Secondary | ICD-10-CM | POA: Diagnosis not present

## 2020-01-15 DIAGNOSIS — Z125 Encounter for screening for malignant neoplasm of prostate: Secondary | ICD-10-CM | POA: Diagnosis not present

## 2020-01-15 DIAGNOSIS — Z95810 Presence of automatic (implantable) cardiac defibrillator: Secondary | ICD-10-CM | POA: Diagnosis not present

## 2020-01-15 DIAGNOSIS — Z0000 Encounter for general adult medical examination without abnormal findings: Secondary | ICD-10-CM | POA: Diagnosis not present

## 2020-01-15 DIAGNOSIS — I10 Essential (primary) hypertension: Secondary | ICD-10-CM | POA: Diagnosis not present

## 2020-01-17 DIAGNOSIS — Z95810 Presence of automatic (implantable) cardiac defibrillator: Secondary | ICD-10-CM | POA: Diagnosis not present

## 2020-01-17 DIAGNOSIS — Z7901 Long term (current) use of anticoagulants: Secondary | ICD-10-CM | POA: Diagnosis not present

## 2020-01-17 DIAGNOSIS — I10 Essential (primary) hypertension: Secondary | ICD-10-CM | POA: Diagnosis not present

## 2020-01-17 DIAGNOSIS — I4430 Unspecified atrioventricular block: Secondary | ICD-10-CM | POA: Diagnosis not present

## 2020-01-17 DIAGNOSIS — I469 Cardiac arrest, cause unspecified: Secondary | ICD-10-CM | POA: Diagnosis not present

## 2020-01-17 DIAGNOSIS — I4821 Permanent atrial fibrillation: Secondary | ICD-10-CM | POA: Diagnosis not present

## 2020-01-21 DIAGNOSIS — Z4502 Encounter for adjustment and management of automatic implantable cardiac defibrillator: Secondary | ICD-10-CM | POA: Diagnosis not present

## 2020-01-21 DIAGNOSIS — Z95810 Presence of automatic (implantable) cardiac defibrillator: Secondary | ICD-10-CM | POA: Diagnosis not present

## 2020-01-21 DIAGNOSIS — I5042 Chronic combined systolic (congestive) and diastolic (congestive) heart failure: Secondary | ICD-10-CM | POA: Diagnosis not present

## 2020-01-22 DIAGNOSIS — I4821 Permanent atrial fibrillation: Secondary | ICD-10-CM | POA: Diagnosis not present

## 2020-01-22 DIAGNOSIS — Z7901 Long term (current) use of anticoagulants: Secondary | ICD-10-CM | POA: Diagnosis not present

## 2020-01-31 ENCOUNTER — Telehealth: Payer: Self-pay

## 2020-01-31 NOTE — Telephone Encounter (Signed)
-----   Message from Yates Decamp, MD sent at 01/18/2020  6:49 AM EDT ----- Regarding: ICD Normal function. No CHF.  Scheduled Remote ICD check 01/10/2020: No VHR episodes. Health trends (patient activity, heart rate variability, average heart rates) are stable. Trans-thoracic impedance trends and the Optivol Fluid Index do not suggest fluid accumulation.  Battery longevity is 1.6 years. RV pacing is 90.7% and LV pacing is 90.7%.

## 2020-01-31 NOTE — Telephone Encounter (Signed)
Called patient regarding his ICD check results. No answer, no VMbox to leave a message.

## 2020-02-01 ENCOUNTER — Other Ambulatory Visit: Payer: Self-pay | Admitting: Cardiology

## 2020-02-01 DIAGNOSIS — I10 Essential (primary) hypertension: Secondary | ICD-10-CM

## 2020-02-01 DIAGNOSIS — I5042 Chronic combined systolic (congestive) and diastolic (congestive) heart failure: Secondary | ICD-10-CM

## 2020-02-05 DIAGNOSIS — R69 Illness, unspecified: Secondary | ICD-10-CM | POA: Diagnosis not present

## 2020-02-05 DIAGNOSIS — Z7901 Long term (current) use of anticoagulants: Secondary | ICD-10-CM | POA: Diagnosis not present

## 2020-02-05 DIAGNOSIS — I4821 Permanent atrial fibrillation: Secondary | ICD-10-CM | POA: Diagnosis not present

## 2020-02-14 ENCOUNTER — Telehealth: Payer: Self-pay | Admitting: Cardiology

## 2020-02-14 DIAGNOSIS — E561 Deficiency of vitamin K: Secondary | ICD-10-CM | POA: Diagnosis not present

## 2020-02-14 DIAGNOSIS — Z7901 Long term (current) use of anticoagulants: Secondary | ICD-10-CM | POA: Diagnosis not present

## 2020-02-14 DIAGNOSIS — I4821 Permanent atrial fibrillation: Secondary | ICD-10-CM | POA: Diagnosis not present

## 2020-02-14 DIAGNOSIS — D649 Anemia, unspecified: Secondary | ICD-10-CM | POA: Diagnosis not present

## 2020-02-15 DIAGNOSIS — J4540 Moderate persistent asthma, uncomplicated: Secondary | ICD-10-CM | POA: Diagnosis not present

## 2020-02-15 NOTE — Telephone Encounter (Signed)
I called patient regarding his remote ICD check results.  FYI: Patient has been transferred to [employer] in [place], and they have not yet transferred him in, but he will call them today to let them know.

## 2020-02-21 DIAGNOSIS — Z7901 Long term (current) use of anticoagulants: Secondary | ICD-10-CM | POA: Diagnosis not present

## 2020-02-21 DIAGNOSIS — D649 Anemia, unspecified: Secondary | ICD-10-CM | POA: Diagnosis not present

## 2020-02-21 DIAGNOSIS — Z95810 Presence of automatic (implantable) cardiac defibrillator: Secondary | ICD-10-CM | POA: Diagnosis not present

## 2020-02-21 DIAGNOSIS — I5042 Chronic combined systolic (congestive) and diastolic (congestive) heart failure: Secondary | ICD-10-CM | POA: Diagnosis not present

## 2020-02-21 DIAGNOSIS — Z4502 Encounter for adjustment and management of automatic implantable cardiac defibrillator: Secondary | ICD-10-CM | POA: Diagnosis not present

## 2020-02-21 DIAGNOSIS — I4821 Permanent atrial fibrillation: Secondary | ICD-10-CM | POA: Diagnosis not present

## 2020-02-23 ENCOUNTER — Other Ambulatory Visit: Payer: Self-pay | Admitting: Cardiology

## 2020-02-26 ENCOUNTER — Other Ambulatory Visit: Payer: Self-pay | Admitting: Cardiology

## 2020-02-26 DIAGNOSIS — I5042 Chronic combined systolic (congestive) and diastolic (congestive) heart failure: Secondary | ICD-10-CM

## 2020-02-26 DIAGNOSIS — I10 Essential (primary) hypertension: Secondary | ICD-10-CM

## 2020-03-20 DIAGNOSIS — I4821 Permanent atrial fibrillation: Secondary | ICD-10-CM | POA: Diagnosis not present

## 2020-03-20 DIAGNOSIS — E561 Deficiency of vitamin K: Secondary | ICD-10-CM | POA: Diagnosis not present

## 2020-03-20 DIAGNOSIS — Z7901 Long term (current) use of anticoagulants: Secondary | ICD-10-CM | POA: Diagnosis not present

## 2020-03-27 DIAGNOSIS — I4821 Permanent atrial fibrillation: Secondary | ICD-10-CM | POA: Diagnosis not present

## 2020-03-27 DIAGNOSIS — Z7901 Long term (current) use of anticoagulants: Secondary | ICD-10-CM | POA: Diagnosis not present

## 2020-03-27 DIAGNOSIS — E561 Deficiency of vitamin K: Secondary | ICD-10-CM | POA: Diagnosis not present

## 2020-04-15 DIAGNOSIS — Z95810 Presence of automatic (implantable) cardiac defibrillator: Secondary | ICD-10-CM | POA: Diagnosis not present

## 2020-04-15 DIAGNOSIS — E561 Deficiency of vitamin K: Secondary | ICD-10-CM | POA: Diagnosis not present

## 2020-04-15 DIAGNOSIS — D649 Anemia, unspecified: Secondary | ICD-10-CM | POA: Diagnosis not present

## 2020-04-15 DIAGNOSIS — J4540 Moderate persistent asthma, uncomplicated: Secondary | ICD-10-CM | POA: Diagnosis not present

## 2020-04-15 DIAGNOSIS — I469 Cardiac arrest, cause unspecified: Secondary | ICD-10-CM | POA: Diagnosis not present

## 2020-04-15 DIAGNOSIS — Z7901 Long term (current) use of anticoagulants: Secondary | ICD-10-CM | POA: Diagnosis not present

## 2020-04-15 DIAGNOSIS — I4821 Permanent atrial fibrillation: Secondary | ICD-10-CM | POA: Diagnosis not present

## 2020-04-18 DIAGNOSIS — Z95810 Presence of automatic (implantable) cardiac defibrillator: Secondary | ICD-10-CM | POA: Diagnosis not present

## 2020-04-18 DIAGNOSIS — Z8674 Personal history of sudden cardiac arrest: Secondary | ICD-10-CM | POA: Diagnosis not present

## 2020-04-18 DIAGNOSIS — I4821 Permanent atrial fibrillation: Secondary | ICD-10-CM | POA: Diagnosis not present

## 2020-04-18 DIAGNOSIS — E119 Type 2 diabetes mellitus without complications: Secondary | ICD-10-CM | POA: Diagnosis not present

## 2020-04-24 DIAGNOSIS — E561 Deficiency of vitamin K: Secondary | ICD-10-CM | POA: Diagnosis not present

## 2020-04-24 DIAGNOSIS — I469 Cardiac arrest, cause unspecified: Secondary | ICD-10-CM | POA: Diagnosis not present

## 2020-04-24 DIAGNOSIS — I4821 Permanent atrial fibrillation: Secondary | ICD-10-CM | POA: Diagnosis not present

## 2020-04-24 DIAGNOSIS — Z95810 Presence of automatic (implantable) cardiac defibrillator: Secondary | ICD-10-CM | POA: Diagnosis not present

## 2020-04-24 DIAGNOSIS — Z7901 Long term (current) use of anticoagulants: Secondary | ICD-10-CM | POA: Diagnosis not present

## 2020-04-25 DIAGNOSIS — Z1159 Encounter for screening for other viral diseases: Secondary | ICD-10-CM | POA: Diagnosis not present

## 2020-04-28 DIAGNOSIS — J45909 Unspecified asthma, uncomplicated: Secondary | ICD-10-CM | POA: Diagnosis not present

## 2020-05-01 DIAGNOSIS — J45909 Unspecified asthma, uncomplicated: Secondary | ICD-10-CM | POA: Diagnosis not present

## 2020-05-05 DIAGNOSIS — J449 Chronic obstructive pulmonary disease, unspecified: Secondary | ICD-10-CM | POA: Diagnosis not present

## 2020-05-07 ENCOUNTER — Other Ambulatory Visit: Payer: Self-pay | Admitting: Cardiology

## 2020-05-13 DIAGNOSIS — R69 Illness, unspecified: Secondary | ICD-10-CM | POA: Diagnosis not present

## 2020-07-26 ENCOUNTER — Other Ambulatory Visit: Payer: Self-pay | Admitting: Cardiology

## 2020-07-26 DIAGNOSIS — I10 Essential (primary) hypertension: Secondary | ICD-10-CM

## 2020-07-26 DIAGNOSIS — I5042 Chronic combined systolic (congestive) and diastolic (congestive) heart failure: Secondary | ICD-10-CM

## 2020-08-27 ENCOUNTER — Other Ambulatory Visit: Payer: Self-pay | Admitting: Cardiology

## 2020-10-01 ENCOUNTER — Other Ambulatory Visit: Payer: Self-pay

## 2020-10-01 DIAGNOSIS — I10 Essential (primary) hypertension: Secondary | ICD-10-CM

## 2020-10-01 DIAGNOSIS — I5042 Chronic combined systolic (congestive) and diastolic (congestive) heart failure: Secondary | ICD-10-CM

## 2020-11-04 ENCOUNTER — Other Ambulatory Visit: Payer: Self-pay | Admitting: Cardiology

## 2021-01-04 ENCOUNTER — Other Ambulatory Visit: Payer: Self-pay | Admitting: Cardiology

## 2021-07-08 ENCOUNTER — Other Ambulatory Visit: Payer: Self-pay | Admitting: Cardiology
